# Patient Record
Sex: Female | Born: 1975 | Race: White | Hispanic: No | Marital: Married | Smoking: Current every day smoker
Health system: Southern US, Community
[De-identification: ages and names within clinical notes are randomized; demographics above are authoritative.]

## PROBLEM LIST (undated history)

## (undated) DIAGNOSIS — E039 Hypothyroidism, unspecified: Secondary | ICD-10-CM

## (undated) DIAGNOSIS — K219 Gastro-esophageal reflux disease without esophagitis: Secondary | ICD-10-CM

## (undated) DIAGNOSIS — G4730 Sleep apnea, unspecified: Secondary | ICD-10-CM

## (undated) DIAGNOSIS — Z8489 Family history of other specified conditions: Secondary | ICD-10-CM

## (undated) DIAGNOSIS — J45909 Unspecified asthma, uncomplicated: Secondary | ICD-10-CM

## (undated) DIAGNOSIS — F39 Unspecified mood [affective] disorder: Secondary | ICD-10-CM

## (undated) DIAGNOSIS — Z8632 Personal history of gestational diabetes: Secondary | ICD-10-CM

## (undated) DIAGNOSIS — F419 Anxiety disorder, unspecified: Secondary | ICD-10-CM

## (undated) DIAGNOSIS — T7840XA Allergy, unspecified, initial encounter: Secondary | ICD-10-CM

## (undated) DIAGNOSIS — E079 Disorder of thyroid, unspecified: Secondary | ICD-10-CM

## (undated) HISTORY — DX: Disorder of thyroid, unspecified: E07.9

## (undated) HISTORY — DX: Sleep apnea, unspecified: G47.30

## (undated) HISTORY — DX: Allergy, unspecified, initial encounter: T78.40XA

## (undated) HISTORY — DX: Anxiety disorder, unspecified: F41.9

---

## 1998-02-11 ENCOUNTER — Other Ambulatory Visit: Admission: RE | Admit: 1998-02-11 | Discharge: 1998-02-11 | Payer: Self-pay | Admitting: Obstetrics and Gynecology

## 1998-09-12 ENCOUNTER — Inpatient Hospital Stay (HOSPITAL_COMMUNITY): Admission: AD | Admit: 1998-09-12 | Discharge: 1998-09-15 | Payer: Self-pay | Admitting: Obstetrics and Gynecology

## 1998-10-15 ENCOUNTER — Other Ambulatory Visit: Admission: RE | Admit: 1998-10-15 | Discharge: 1998-10-15 | Payer: Self-pay | Admitting: Obstetrics and Gynecology

## 1999-12-10 ENCOUNTER — Emergency Department (HOSPITAL_COMMUNITY): Admission: EM | Admit: 1999-12-10 | Discharge: 1999-12-10 | Payer: Self-pay | Admitting: Emergency Medicine

## 1999-12-10 ENCOUNTER — Encounter: Payer: Self-pay | Admitting: Emergency Medicine

## 2000-01-08 ENCOUNTER — Other Ambulatory Visit: Admission: RE | Admit: 2000-01-08 | Discharge: 2000-01-08 | Payer: Self-pay | Admitting: Obstetrics and Gynecology

## 2000-07-08 ENCOUNTER — Inpatient Hospital Stay (HOSPITAL_COMMUNITY): Admission: AD | Admit: 2000-07-08 | Discharge: 2000-07-08 | Payer: Self-pay | Admitting: Obstetrics & Gynecology

## 2000-07-15 ENCOUNTER — Inpatient Hospital Stay (HOSPITAL_COMMUNITY): Admission: AD | Admit: 2000-07-15 | Discharge: 2000-07-16 | Payer: Self-pay | Admitting: Obstetrics and Gynecology

## 2000-07-22 ENCOUNTER — Encounter: Admission: RE | Admit: 2000-07-22 | Discharge: 2000-10-20 | Payer: Self-pay | Admitting: Obstetrics and Gynecology

## 2001-08-17 ENCOUNTER — Other Ambulatory Visit: Admission: RE | Admit: 2001-08-17 | Discharge: 2001-08-17 | Payer: Self-pay | Admitting: Obstetrics and Gynecology

## 2001-09-12 ENCOUNTER — Ambulatory Visit (HOSPITAL_COMMUNITY): Admission: RE | Admit: 2001-09-12 | Discharge: 2001-09-12 | Payer: Self-pay | Admitting: Obstetrics and Gynecology

## 2001-09-12 ENCOUNTER — Encounter: Payer: Self-pay | Admitting: Obstetrics and Gynecology

## 2001-11-21 ENCOUNTER — Encounter: Payer: Self-pay | Admitting: Obstetrics and Gynecology

## 2001-11-21 ENCOUNTER — Ambulatory Visit (HOSPITAL_COMMUNITY): Admission: RE | Admit: 2001-11-21 | Discharge: 2001-11-21 | Payer: Self-pay | Admitting: Obstetrics and Gynecology

## 2002-01-20 ENCOUNTER — Inpatient Hospital Stay (HOSPITAL_COMMUNITY): Admission: AD | Admit: 2002-01-20 | Discharge: 2002-01-23 | Payer: Self-pay | Admitting: Obstetrics and Gynecology

## 2002-01-21 ENCOUNTER — Encounter: Payer: Self-pay | Admitting: Obstetrics and Gynecology

## 2004-06-18 ENCOUNTER — Ambulatory Visit: Payer: Self-pay | Admitting: Family Medicine

## 2004-10-05 ENCOUNTER — Ambulatory Visit: Payer: Self-pay | Admitting: Family Medicine

## 2004-10-06 ENCOUNTER — Ambulatory Visit: Payer: Self-pay | Admitting: *Deleted

## 2004-10-06 ENCOUNTER — Ambulatory Visit: Payer: Self-pay | Admitting: Family Medicine

## 2004-10-13 ENCOUNTER — Ambulatory Visit: Payer: Self-pay | Admitting: Family Medicine

## 2004-10-27 ENCOUNTER — Ambulatory Visit: Payer: Self-pay | Admitting: Family Medicine

## 2004-11-03 ENCOUNTER — Ambulatory Visit: Payer: Self-pay | Admitting: Family Medicine

## 2004-11-04 ENCOUNTER — Encounter (INDEPENDENT_AMBULATORY_CARE_PROVIDER_SITE_OTHER): Payer: Self-pay | Admitting: Family Medicine

## 2004-11-04 LAB — CONVERTED CEMR LAB: Pap Smear: NORMAL

## 2004-11-10 ENCOUNTER — Ambulatory Visit: Payer: Self-pay | Admitting: Family Medicine

## 2004-12-01 ENCOUNTER — Ambulatory Visit: Payer: Self-pay | Admitting: Family Medicine

## 2005-10-27 ENCOUNTER — Ambulatory Visit: Payer: Self-pay | Admitting: Family Medicine

## 2005-11-18 ENCOUNTER — Ambulatory Visit: Payer: Self-pay | Admitting: Family Medicine

## 2006-11-08 ENCOUNTER — Ambulatory Visit: Payer: Self-pay | Admitting: Family Medicine

## 2007-02-20 ENCOUNTER — Ambulatory Visit: Payer: Self-pay | Admitting: Internal Medicine

## 2007-04-24 ENCOUNTER — Ambulatory Visit: Payer: Self-pay | Admitting: Internal Medicine

## 2007-04-24 LAB — CONVERTED CEMR LAB: TSH: 1.773 microintl units/mL (ref 0.350–5.50)

## 2007-05-03 ENCOUNTER — Encounter (INDEPENDENT_AMBULATORY_CARE_PROVIDER_SITE_OTHER): Payer: Self-pay | Admitting: *Deleted

## 2007-06-29 ENCOUNTER — Encounter (INDEPENDENT_AMBULATORY_CARE_PROVIDER_SITE_OTHER): Payer: Self-pay | Admitting: Family Medicine

## 2007-06-29 DIAGNOSIS — F17200 Nicotine dependence, unspecified, uncomplicated: Secondary | ICD-10-CM | POA: Insufficient documentation

## 2007-06-29 DIAGNOSIS — E039 Hypothyroidism, unspecified: Secondary | ICD-10-CM | POA: Insufficient documentation

## 2007-06-29 DIAGNOSIS — F411 Generalized anxiety disorder: Secondary | ICD-10-CM | POA: Insufficient documentation

## 2007-12-29 ENCOUNTER — Ambulatory Visit: Payer: Self-pay | Admitting: Internal Medicine

## 2008-01-22 ENCOUNTER — Ambulatory Visit: Payer: Self-pay | Admitting: Internal Medicine

## 2008-08-06 ENCOUNTER — Ambulatory Visit: Payer: Self-pay | Admitting: Family Medicine

## 2008-08-06 LAB — CONVERTED CEMR LAB
BUN: 13 mg/dL (ref 6–23)
Calcium: 9.4 mg/dL (ref 8.4–10.5)
Cholesterol: 251 mg/dL — ABNORMAL HIGH (ref 0–200)
Creatinine, Ser: 0.91 mg/dL (ref 0.40–1.20)
Eosinophils Absolute: 0.8 10*3/uL — ABNORMAL HIGH (ref 0.0–0.7)
Eosinophils Relative: 9 % — ABNORMAL HIGH (ref 0–5)
Free T4: 0.3 ng/dL — ABNORMAL LOW (ref 0.89–1.80)
Glucose, Bld: 75 mg/dL (ref 70–99)
HCT: 46 % (ref 36.0–46.0)
Lymphs Abs: 2.9 10*3/uL (ref 0.7–4.0)
MCV: 92.4 fL (ref 78.0–100.0)
Monocytes Absolute: 0.8 10*3/uL (ref 0.1–1.0)
Platelets: 251 10*3/uL (ref 150–400)
Sodium: 139 meq/L (ref 135–145)
Total CHOL/HDL Ratio: 3.6
WBC: 9.3 10*3/uL (ref 4.0–10.5)

## 2009-03-05 ENCOUNTER — Ambulatory Visit: Payer: Self-pay | Admitting: Family Medicine

## 2009-04-07 ENCOUNTER — Encounter (INDEPENDENT_AMBULATORY_CARE_PROVIDER_SITE_OTHER): Payer: Self-pay | Admitting: Adult Health

## 2009-04-07 ENCOUNTER — Ambulatory Visit: Payer: Self-pay | Admitting: Family Medicine

## 2009-04-07 LAB — CONVERTED CEMR LAB
T3 Uptake Ratio: 47.5 % — ABNORMAL HIGH (ref 22.5–37.0)
T4, Total: 12.3 ug/dL (ref 5.0–12.5)

## 2009-06-11 ENCOUNTER — Encounter (INDEPENDENT_AMBULATORY_CARE_PROVIDER_SITE_OTHER): Payer: Self-pay | Admitting: Adult Health

## 2009-06-11 ENCOUNTER — Ambulatory Visit: Payer: Self-pay | Admitting: Family Medicine

## 2009-09-18 ENCOUNTER — Encounter: Admission: RE | Admit: 2009-09-18 | Discharge: 2009-09-18 | Payer: Self-pay | Admitting: Internal Medicine

## 2009-10-10 ENCOUNTER — Encounter: Admission: RE | Admit: 2009-10-10 | Discharge: 2009-10-10 | Payer: Self-pay | Admitting: Internal Medicine

## 2011-01-01 NOTE — H&P (Signed)
[facility]  Patient:    Valerie Bernard, Valerie Bernard                      MRN: [id number] Adm. Date:  [id number] Disc. Date: [id number] Attending:  Pleas Koch Dictator:   Mack Guise, C.N.M.                         History and Physical  HISTORY OF PRESENT ILLNESS:   Ms. Leamer is a 35 year old gravida 3, para 1-0-1-1 at 39-5/7 weeks (EDD July 17, 2000).  She presents with increasing symptoms of essential labor.  She reports positive fetal movement, no bleeding, no rupture of membranes.  She denies any headaches, visual changes or epigastric pain.  CURRENT PREGNANCY:  Her pregnancy has been followed by the [facility] and is remarkable for: 1. Hypothyroidism. 2. Group B strep negative.  This patient was initially evaluated at the office of [facility] on Jan 07, 2000 at 12 weeks 3 days.  Her EDD was determined by 8 weeks 4 day ultrasound, and confirmed with follow up ultrasound.  Her prenatal course has been unremarkable, and throughout the growth of uterine fundus has been noted to be compatible in size for the infant.  Systolic blood pressure has ranged from 100 to 120.  Diastolic blood pressure has ranged from 60 to 70.  There has been no significant proteinuria.  PRENATAL LAB WORK:  Hemoglobin 13.9, hematocrit 41.8 (December 10, 1999), platelets 234,000.  Blood type and Rh 0 positive.  Antibody screen negative. VDRL nonreactive.  Rubella immune.  Hepatitis B surface antigen.  GC and Chlamydia negative.  Pap smear within normal limits.  Positive yeast.  TSH at first trimester 34.126.  On January 16, 2000 AFP/3 ______ HCG within normal range.  At 36 weeks culture of the vaginal tract was negative for Group B strep.  OB HISTORY: 1. Induced AB 1998. 2. January 2000 normal spontaneous vaginal delivery, with the birth of an    8 pound 14 ounce female infant.   No complications.  MEDICAL HISTORY: DD:  07/15/00 TD:  07/15/00 Job: [id number]

## 2011-01-01 NOTE — H&P (Signed)
[facility]  Patient:    Valerie Bernard, Valerie Bernard                      MRN: [id number] Adm. Date:  [id number] Disc. Date: [id number] Attending:  Pleas Koch Dictator:   Mack Guise, C.N.M.                         History and Physical  PAST MEDICAL HISTORY:         The patient has a history of hypothyroidism.  FAMILY HISTORY:               Maternal grandmother has varicose veins otherwise unremarkable.  GENETIC HISTORY:              There is no family history of genetic or chromosomal disorders, or children that died in infancy, or that were born with birth defects.  ALLERGIES:                    No known drug allergies.  MEDICATIONS:                  Synthroid and prenatal vitamins.  HABITS:                       The patient denies the use of tobacco, alcohol or illicit drugs.  SOCIAL HISTORY:               [name] is a 35 year old, Caucasian female. Her husband, Genine Beckett, works as a Tree surgeon.  He is involved and supportive.  They are of the [place] faith.  REVIEW OF SYSTEMS:            There are no signs or symptoms suggestive of focal or systemic disease and the patient is a typical one with a uterine pregnancy at term in early labor.  PHYSICAL EXAMINATION:  VITAL SIGNS:                  Stable, afebrile.  Fetal heart rate 130-140 with average long-term variability.  Reactivity is present with no periodic changes.  HEENT:                        Unremarkable.  Thyroid not enlarged.  HEART:                        Regular rate and rhythm.  LUNGS:                        Clear.  ABDOMEN:                      Gravid in its contour.  Uterine fundus is noted to extend 40 cm above the level of the pubic symphysis.  Thayer Ohm maneuvers finds the infant being in longitudinal lie and cephalic presentation and the estimated fetal weight is 8 to 8-1/2 pounds.  PELVIC:                       Digital examination of the cervix finds it  to be 4 cm dilated, 80% effaced with the cephalic presenting part at a -3 station and membranes intact per RN examination in MAU.  ASSESSMENT:  Intrauterine pregnancy, at term in early labor.  PLAN:                         Admit to birthing suite per Dr. Dierdre Forth. Routine CNM orders.  The patient may have epidural.  Anticipate spontaneous vaginal delivery. DD:  07/15/00 TD:  07/15/00 Job: [id number]

## 2012-02-24 ENCOUNTER — Ambulatory Visit: Payer: Self-pay | Admitting: Physician Assistant

## 2012-02-24 VITALS — BP 124/76 | HR 65 | Temp 97.9°F | Resp 18 | Ht 65.5 in | Wt 202.0 lb

## 2012-02-24 DIAGNOSIS — E039 Hypothyroidism, unspecified: Secondary | ICD-10-CM

## 2012-02-24 MED ORDER — LEVOTHYROXINE SODIUM 200 MCG PO TABS: 200.0000 ug | ORAL_TABLET | Freq: Every day | ORAL | Status: DC

## 2012-02-24 NOTE — Progress Notes (Signed)
  Subjective:    Patient ID: Valerie Bernard, female    DOB: 1975/09/24, 36 y.o.   MRN: [id number]  HPI Valerie Bernard comes in today requesting she have her thyroid checked and medication refilled.  She has been on Synthroid 200 mcg for several years but has had to have her TSH checked frequently in the last 6 months due to non compliance and levels being unstable.  She has been on her meds consistently now for some time and states that she has no abnormal complaints.  She states that she is always tired, overweight and has dry skin.  She also notes that her goal range was a lower TSH for better symptomatic relief.  She states that she is healthy otherwise and is on no other meds   Review of Systems ROS As noted in HPI, otherwise negative     Objective:   Physical Exam  Constitutional: She is oriented to person, place, and time. She appears well-developed and well-nourished.  Eyes: Pupils are equal, round, and reactive to light. No scleral icterus.  Neck: No thyromegaly present.  Cardiovascular: Normal rate and regular rhythm.   Pulmonary/Chest: Effort normal and breath sounds normal.  Neurological: She is alert and oriented to person, place, and time.  Skin: Skin is warm and dry.          Assessment & Plan:  Hypothyroidism  Check TSH Synthroid 200 mcg one a day.  RF for 3 months.  Asked that she get prior lab results to [place] if we are going to continue prescribing her medication.

## 2012-02-24 NOTE — Patient Instructions (Addendum)
We will determine your recheck after your labs are back AND we can review your previous TSH levels from [facility].  Please get that information to [place] when possible in the near future

## 2012-03-14 ENCOUNTER — Other Ambulatory Visit: Payer: Self-pay | Admitting: Physician Assistant

## 2012-03-14 NOTE — Telephone Encounter (Signed)
Pharmacy sent message requesting generic, I called and authorized.

## 2012-08-25 ENCOUNTER — Encounter: Payer: Self-pay | Admitting: Family Medicine

## 2012-08-25 ENCOUNTER — Ambulatory Visit (INDEPENDENT_AMBULATORY_CARE_PROVIDER_SITE_OTHER): Payer: 59 | Admitting: Family Medicine

## 2012-08-25 VITALS — BP 122/82 | HR 70 | Temp 98.2°F | Resp 16 | Ht 64.5 in | Wt 205.0 lb

## 2012-08-25 DIAGNOSIS — E039 Hypothyroidism, unspecified: Secondary | ICD-10-CM

## 2012-08-25 DIAGNOSIS — R42 Dizziness and giddiness: Secondary | ICD-10-CM

## 2012-08-25 DIAGNOSIS — F17200 Nicotine dependence, unspecified, uncomplicated: Secondary | ICD-10-CM

## 2012-08-25 DIAGNOSIS — Z23 Encounter for immunization: Secondary | ICD-10-CM

## 2012-08-25 MED ORDER — LEVOTHYROXINE SODIUM 200 MCG PO TABS: 200.0000 ug | ORAL_TABLET | Freq: Every day | ORAL | Status: DC

## 2012-08-25 NOTE — Patient Instructions (Signed)
Benign Positional Vertigo  Vertigo means you feel like you or your surroundings are moving when they are not. Benign positional vertigo is the most common form of vertigo. Benign means that the cause of your condition is not serious. Benign positional vertigo is more common in older adults.  CAUSES   Benign positional vertigo is the result of an upset in the labyrinth system. This is an area in the middle ear that helps control your balance. This may be caused by a viral infection, head injury, or repetitive motion. However, often no specific cause is found.  SYMPTOMS   Symptoms of benign positional vertigo occur when you move your head or eyes in different directions. Some of the symptoms may include:  · Loss of balance and falls.  · Vomiting.  · Blurred vision.  · Dizziness.  · Nausea.  · Involuntary eye movements (nystagmus).  DIAGNOSIS   Benign positional vertigo is usually diagnosed by physical exam. If the specific cause of your benign positional vertigo is unknown, your caregiver may perform imaging tests, such as magnetic resonance imaging (MRI) or computed tomography (CT).  TREATMENT   Your caregiver may recommend movements or procedures to correct the benign positional vertigo. Medicines such as meclizine, benzodiazepines, and medicines for nausea may be used to treat your symptoms. In rare cases, if your symptoms are caused by certain conditions that affect the inner ear, you may need surgery.  HOME CARE INSTRUCTIONS   · Follow your caregiver's instructions.  · Move slowly. Do not make sudden body or head movements.  · Avoid driving.  · Avoid operating heavy machinery.  · Avoid performing any tasks that would be dangerous to you or others during a vertigo episode.  · Drink enough fluids to keep your urine clear or pale yellow.  SEEK IMMEDIATE MEDICAL CARE IF:   · You develop problems with walking, weakness, numbness, or using your arms, hands, or legs.  · You have difficulty speaking.  · You develop  severe headaches.  · Your nausea or vomiting continues or gets worse.  · You develop visual changes.  · Your family or friends notice any behavioral changes.  · Your condition gets worse.  · You have a fever.  · You develop a stiff neck or sensitivity to light.  MAKE SURE YOU:   · Understand these instructions.  · Will watch your condition.  · Will get help right away if you are not doing well or get worse.  Document Released: 05/10/2006 Document Revised: 10/25/2011 Document Reviewed: 04/22/2011  ExitCare® Patient Information ©2013 [employer].

## 2012-10-06 ENCOUNTER — Ambulatory Visit (INDEPENDENT_AMBULATORY_CARE_PROVIDER_SITE_OTHER): Payer: 59 | Admitting: Family Medicine

## 2012-10-06 ENCOUNTER — Encounter: Payer: Self-pay | Admitting: Family Medicine

## 2012-10-06 VITALS — BP 110/74 | HR 75 | Temp 98.1°F | Resp 16 | Ht 65.5 in | Wt 203.8 lb

## 2012-10-06 DIAGNOSIS — M792 Neuralgia and neuritis, unspecified: Secondary | ICD-10-CM

## 2012-10-06 DIAGNOSIS — E039 Hypothyroidism, unspecified: Secondary | ICD-10-CM

## 2012-10-06 DIAGNOSIS — M5410 Radiculopathy, site unspecified: Secondary | ICD-10-CM

## 2012-10-06 DIAGNOSIS — M5137 Other intervertebral disc degeneration, lumbosacral region: Secondary | ICD-10-CM

## 2012-10-06 DIAGNOSIS — IMO0002 Reserved for concepts with insufficient information to code with codable children: Secondary | ICD-10-CM

## 2012-10-06 LAB — CBC WITH DIFFERENTIAL/PLATELET
Basophils Absolute: 0 10*3/uL (ref 0.0–0.1)
Basophils Relative: 1 % (ref 0–1)
Eosinophils Relative: 5 % (ref 0–5)
HCT: 41.3 % (ref 36.0–46.0)
MCH: 30.8 pg (ref 26.0–34.0)
MCHC: 34.6 g/dL (ref 30.0–36.0)
MCV: 88.8 fL (ref 78.0–100.0)
Monocytes Absolute: 0.5 10*3/uL (ref 0.1–1.0)
RDW: 12.9 % (ref 11.5–15.5)

## 2012-10-06 LAB — COMPREHENSIVE METABOLIC PANEL
AST: 16 U/L (ref 0–37)
Alkaline Phosphatase: 54 U/L (ref 39–117)
BUN: 12 mg/dL (ref 6–23)
Calcium: 9.2 mg/dL (ref 8.4–10.5)
Chloride: 103 mEq/L (ref 96–112)
Creat: 0.81 mg/dL (ref 0.50–1.10)

## 2012-10-06 LAB — LIPID PANEL
HDL: 52 mg/dL (ref >39)
Triglycerides: 84 mg/dL (ref <150)

## 2012-10-06 LAB — RPR

## 2012-10-06 LAB — TSH: TSH: 12.047 u[IU]/mL — ABNORMAL HIGH (ref 0.350–4.500)

## 2012-10-06 NOTE — Progress Notes (Signed)
Subjective:    Patient ID: Valerie Bernard, female    DOB: 04-01-1976, 37 y.o.   MRN: [id number] Chief Complaint  Patient presents with  . Follow-up    6 wks on thyroid    HPI  Trying to get used to taking levothyroxine every day but sometimes forgets and will take after breakfast.  She took one last night as she forgot it yest a.m. And did not take this morning - she is not sure.   Today she is fasting.  Also started a women's mvi today.  Just saw provider at [facility] the last time she had a pap smear - was prob about 2 yrs ago- so not sure when and had an abnml pap smear many yrs ago but was nml on recheck.  She has two herniated disc in her low back - prob L4-5, L5-S1. She has had problems with these in the past and now they are worsening.  she was having some left sciatica from this and now she is wondering if she having problems in her right leg as well now - pain down her leg and lateral side of right foot.  During the first flaire, her symptoms resolved after physical therapy a lone.  These sxs recurred just starting 1 wk ago. It is constant - worse with standing.  No sig back pain. - more the leg pain that is really bothering her.  Past Medical History  Diagnosis Date  . Thyroid disease   . Anxiety    Current Outpatient Prescriptions on File Prior to Visit  Medication Sig Dispense Refill  . levothyroxine (SYNTHROID, LEVOTHROID) 200 MCG tablet Take 1 tablet (200 mcg total) by mouth daily.  90 tablet  0   No current facility-administered medications on file prior to visit.   No Known Allergies History reviewed. No pertinent past surgical history. Family History  Problem Relation Age of Onset  . Asthma Mother   . Diabetes Brother   . Arthritis Maternal Grandmother   . Arthritis Maternal Grandfather   . Cancer Paternal Grandfather     lung  . Asthma Daughter   . Allergies Daughter   . Asthma Son    History   Social History  . Marital Status: Married    Spouse Name: N/A    Number of Children: N/A  . Years of Education: N/A   Social History Main Topics  . Smoking status: Current Every Day Smoker -- 0.60 packs/day for 20 years    Types: Cigarettes  . Smokeless tobacco: None  . Alcohol Use: 0.6 oz/week    1 Glasses of wine per week  . Drug Use: None  . Sexually Active: Yes    Birth Control/ Protection: None   Other Topics Concern  . College educated   Social History Narrative  . Works as a Runner, broadcasting/film/video in [employer]   Does not exercise  Review of Systems  Constitutional: Negative for fever, chills and unexpected weight change.  HENT: Negative for trouble swallowing.   Gastrointestinal: Negative for nausea, vomiting, abdominal pain, diarrhea and constipation.  Genitourinary: Negative for urgency, frequency, decreased urine volume and difficulty urinating.  Musculoskeletal: Positive for myalgias and back pain. Negative for joint swelling, arthralgias and gait problem.  Skin: Negative for rash.  Neurological: Positive for weakness and numbness.  Hematological: Negative for adenopathy. Does not bruise/bleed easily.  Psychiatric/Behavioral: Negative for dysphoric mood. The patient is nervous/anxious.       BP 110/74  Pulse 75  Temp(Src) 98.1 F (36.7  C) (Oral)  Resp 16  Ht 5' 5.5" (1.664 m)  Wt 203 lb 12.8 oz (92.443 kg)  BMI 33.39 kg/m2  SpO2 96%  LMP 10/03/2012 Objective:   Physical Exam  Constitutional: She is oriented to person, place, and time. She appears well-developed and well-nourished. No distress.  HENT:  Head: Normocephalic and atraumatic.  Right Ear: External ear normal.  Left Ear: External ear normal.  Eyes: Conjunctivae are normal. No scleral icterus.  Neck: Normal range of motion. Neck supple. No thyromegaly present.  Cardiovascular: Normal rate, regular rhythm, normal heart sounds and intact distal pulses.   Pulmonary/Chest: Effort normal and breath sounds normal. No respiratory distress.  Musculoskeletal: Normal range  of motion. She exhibits no edema.       Right hip: Normal.       Left hip: Normal.       Lumbar back: She exhibits tenderness, pain and spasm. She exhibits normal range of motion, no bony tenderness and no deformity.  Lymphadenopathy:    She has no cervical adenopathy.  Neurological: She is alert and oriented to person, place, and time. She has normal strength and normal reflexes. No sensory deficit. She exhibits normal muscle tone. Coordination and gait normal.  Reflex Scores:      Patellar reflexes are 2+ on the right side and 2+ on the left side.      Achilles reflexes are 2+ on the right side and 2+ on the left side. Skin: Skin is warm and dry. She is not diaphoretic. No erythema.  Psychiatric: She has a normal mood and affect. Her behavior is normal.      Assessment & Plan:   HYPOTHYROIDISM - Plan: TSH, Lipid panel.  Will need to check tsh today and interpret based upon admitted pt non-compliance with med.  Discussed ways to work into daily schedule.  Recheck again in 6-8 wks.  Degeneration of lumbar or lumbosacral intervertebral disc - Plan: Vitamin D 25 hydroxy. As long as cbg is ok, will do prednisone burst and if radiculopathy/weakness/numbness cont or worsen, will refer to PT.  Radiculopathy of leg - Plan: CBC with Differential  Neuralgia - Plan: POCT glucose (manual entry), RPR, CBC with Differential, Comprehensive metabolic panel  HM - Plan for CPP at next OV in 6-8 wks when pt is also due for TSH recheck.  Will need pap at that time and can review labs.  TDaP was within the last 5 hrs per pt.

## 2012-10-07 LAB — VITAMIN D 25 HYDROXY (VIT D DEFICIENCY, FRACTURES): Vit D, 25-Hydroxy: 28 ng/mL — ABNORMAL LOW (ref 30–89)

## 2012-10-08 ENCOUNTER — Encounter: Payer: Self-pay | Admitting: Family Medicine

## 2012-10-09 ENCOUNTER — Telehealth: Payer: Self-pay | Admitting: Family Medicine

## 2012-10-09 MED ORDER — PREDNISONE 20 MG PO TABS: ORAL_TABLET | ORAL | Status: DC

## 2012-10-09 NOTE — Telephone Encounter (Signed)
Prednisone taper sent to pharmacy. 

## 2012-10-09 NOTE — Telephone Encounter (Signed)
I have sent pt a reply to her MyChart email notifying her that we have sent in her prednisone Rx to [facility].

## 2012-10-09 NOTE — Telephone Encounter (Signed)
Prescription for prednisone was not called in to my pharmacy. (via pt advice request message) per Dr. Clelia Croft note she was going to prescribe prednisone. Please advise Dr. Clelia Croft not in office today thanks

## 2012-10-20 ENCOUNTER — Other Ambulatory Visit: Payer: Self-pay | Admitting: Family Medicine

## 2012-10-20 DIAGNOSIS — M5137 Other intervertebral disc degeneration, lumbosacral region: Secondary | ICD-10-CM

## 2012-11-07 NOTE — Progress Notes (Signed)
  Subjective:    Patient ID: Valerie Bernard, female    DOB: Dec 22, 1975, 37 y.o.   MRN: [id number] Chief Complaint  Patient presents with  . Hypothyroidism    thyroid check    HPI  Has had hypothyroid for a long time but is really bad about taking her medication.  Has ran out of her levothyroxine for several weeks and was told she had to come in for blood work in order to restart it.  Has chronic back pain from DDD but it is getting worse and radiating down legs.  No desire for help with smoking cessation at this time - is working on it  Past Medical History  Diagnosis Date  . Thyroid disease   . Anxiety    No current outpatient prescriptions on file prior to visit.   No current facility-administered medications on file prior to visit.   No Known Allergies   Review of Systems  Constitutional: Positive for fatigue and unexpected weight change. Negative for fever, chills, diaphoresis, activity change and appetite change.  HENT: Negative for trouble swallowing.   Cardiovascular: Negative for palpitations.  Gastrointestinal: Negative for diarrhea and constipation.  Endocrine: Negative for cold intolerance and heat intolerance.  Genitourinary: Negative for frequency and decreased urine volume.  Musculoskeletal: Positive for myalgias and back pain. Negative for gait problem.  Skin: Negative for color change, pallor and rash.  Neurological: Negative for tremors, syncope, weakness and numbness.  Psychiatric/Behavioral: Positive for sleep disturbance and decreased concentration. Negative for behavioral problems and dysphoric mood. The patient is nervous/anxious.       BP 122/82  Pulse 70  Temp(Src) 98.2 F (36.8 C) (Oral)  Resp 16  Ht 5' 4.5" (1.638 m)  Wt 205 lb (92.987 kg)  BMI 34.66 kg/m2  LMP 08/11/2012 Objective:   Physical Exam  Constitutional: She is oriented to person, place, and time. She appears well-developed and well-nourished. No distress.  HENT:  Head:  Normocephalic and atraumatic.  Right Ear: External ear normal.  Left Ear: External ear normal.  Eyes: Conjunctivae are normal. No scleral icterus.  Neck: Normal range of motion. Neck supple. No thyromegaly present.  Cardiovascular: Normal rate, regular rhythm, normal heart sounds and intact distal pulses.   Pulmonary/Chest: Effort normal and breath sounds normal. No respiratory distress.  Musculoskeletal: She exhibits no edema.       Lumbar back: Normal.  Lymphadenopathy:    She has no cervical adenopathy.  Neurological: She is alert and oriented to person, place, and time. She has normal strength and normal reflexes. She displays no atrophy. No sensory deficit. She exhibits normal muscle tone. Gait normal.  Skin: Skin is warm and dry. She is not diaphoretic. No erythema.  Psychiatric: She has a normal mood and affect. Her behavior is normal.      Assessment & Plan:  HYPOTHYROIDISM - Plan: levothyroxine (SYNTHROID, LEVOTHROID) 200 MCG tablet  TOBACCO ABUSE  Vertigo  Need for prophylactic vaccination and inoculation against influenza - Plan: Flu vaccine greater than or equal to 3yo preservative free IM Will plan to restart levothyroxine and schedule a CPP in 6-8 wks. Will recheck tsh at that time.  Meds ordered this encounter  Medications  . levothyroxine (SYNTHROID, LEVOTHROID) 200 MCG tablet    Sig: Take 1 tablet (200 mcg total) by mouth daily.    Dispense:  90 tablet    Refill:  0

## 2013-01-05 ENCOUNTER — Other Ambulatory Visit: Payer: Self-pay | Admitting: Family Medicine

## 2013-02-23 ENCOUNTER — Other Ambulatory Visit: Payer: Self-pay | Admitting: Physician Assistant

## 2013-02-23 NOTE — Telephone Encounter (Signed)
Med encounter

## 2013-02-27 ENCOUNTER — Ambulatory Visit (INDEPENDENT_AMBULATORY_CARE_PROVIDER_SITE_OTHER): Payer: 59 | Admitting: Family Medicine

## 2013-02-27 VITALS — BP 124/76 | HR 73 | Temp 98.0°F | Resp 16 | Ht 65.0 in | Wt 205.0 lb

## 2013-02-27 DIAGNOSIS — F17200 Nicotine dependence, unspecified, uncomplicated: Secondary | ICD-10-CM

## 2013-02-27 DIAGNOSIS — E039 Hypothyroidism, unspecified: Secondary | ICD-10-CM

## 2013-02-27 NOTE — Progress Notes (Signed)
  Subjective:    Patient ID: Valerie Bernard, female    DOB: January 28, 1976, 37 y.o.   MRN: [id number] Chief Complaint  Patient presents with  . Follow-up    HPI  Doing much better after PT for lumbar radiculopathy, no further numbness, no weakness, minimal back pain.  Has gotten better at taking the levothyroxine daily.  Managing to take it 5/7 days - if she misses a dose, she takes it whenever she remembers so sometime taking it w/ food or on a full stomach.    Was out on a "girls night" last night so smoked much more than usual.  Plans to make appt for CPP - does not want to do today.    Past Medical History  Diagnosis Date  . Thyroid disease   . Anxiety    Current Outpatient Prescriptions on File Prior to Visit  Medication Sig Dispense Refill  . levothyroxine (SYNTHROID, LEVOTHROID) 200 MCG tablet Take 1 tablet (200 mcg total) by mouth daily before breakfast. Needs office visit and labs  30 tablet  0   No current facility-administered medications on file prior to visit.   No Known Allergies   Review of Systems  Constitutional: Negative for fever, chills, diaphoresis, activity change and appetite change.  Respiratory: Negative for cough, shortness of breath and wheezing.   Gastrointestinal: Negative for diarrhea and constipation.  Musculoskeletal: Negative for myalgias, back pain, joint swelling, arthralgias and gait problem.  Neurological: Negative for weakness and numbness.      BP 124/76  Pulse 73  Temp(Src) 98 F (36.7 C) (Oral)  Resp 16  Ht 5\' 5"  (1.651 m)  Wt 205 lb (92.987 kg)  BMI 34.11 kg/m2  SpO2 98%  LMP 02/22/2013 Objective:   Physical Exam  Constitutional: She is oriented to person, place, and time. She appears well-developed and well-nourished. No distress.  HENT:  Head: Normocephalic and atraumatic.  Right Ear: External ear normal.  Left Ear: External ear normal.  Eyes: Conjunctivae are normal. No scleral icterus.  Neck: Normal range of motion. Neck  supple. No thyromegaly present.  Cardiovascular: Normal rate, regular rhythm, normal heart sounds and intact distal pulses.   Pulmonary/Chest: Effort normal. No respiratory distress. She has rhonchi in the left upper field.  Musculoskeletal: She exhibits no edema.  Lymphadenopathy:    She has no cervical adenopathy.  Neurological: She is alert and oriented to person, place, and time.  Skin: Skin is warm and dry. She is not diaphoretic. No erythema.  Psychiatric: She has a normal mood and affect. Her behavior is normal.     peak flow 450, 450, 400 - goal 487. Assessment & Plan:  Unspecified hypothyroidism - Plan: TSH - thinks she is about as compliant as she is going to get w/ med dose on 200 mcg qd though will try to set timer on phone.  HM - will make an appt for CPP and bring husband.

## 2013-03-01 ENCOUNTER — Other Ambulatory Visit: Payer: Self-pay | Admitting: Family Medicine

## 2013-03-01 MED ORDER — LEVOTHYROXINE SODIUM 50 MCG PO TABS: 50.0000 ug | ORAL_TABLET | Freq: Every day | ORAL | Status: DC

## 2013-03-01 MED ORDER — LEVOTHYROXINE SODIUM 200 MCG PO TABS: 200.0000 ug | ORAL_TABLET | Freq: Every day | ORAL | Status: DC

## 2013-04-11 ENCOUNTER — Emergency Department (HOSPITAL_BASED_OUTPATIENT_CLINIC_OR_DEPARTMENT_OTHER)
Admission: EM | Admit: 2013-04-11 | Discharge: 2013-04-11 | Disposition: A | Discharge status: Home / Self Care | Payer: 59 | Attending: Emergency Medicine | Admitting: Emergency Medicine

## 2013-04-11 ENCOUNTER — Encounter (HOSPITAL_BASED_OUTPATIENT_CLINIC_OR_DEPARTMENT_OTHER): Payer: Self-pay | Admitting: Emergency Medicine

## 2013-04-11 DIAGNOSIS — Z8659 Personal history of other mental and behavioral disorders: Secondary | ICD-10-CM | POA: Insufficient documentation

## 2013-04-11 DIAGNOSIS — N3090 Cystitis, unspecified without hematuria: Secondary | ICD-10-CM

## 2013-04-11 DIAGNOSIS — E079 Disorder of thyroid, unspecified: Secondary | ICD-10-CM | POA: Insufficient documentation

## 2013-04-11 DIAGNOSIS — R350 Frequency of micturition: Secondary | ICD-10-CM | POA: Insufficient documentation

## 2013-04-11 DIAGNOSIS — F17200 Nicotine dependence, unspecified, uncomplicated: Secondary | ICD-10-CM | POA: Insufficient documentation

## 2013-04-11 DIAGNOSIS — Z3202 Encounter for pregnancy test, result negative: Secondary | ICD-10-CM | POA: Insufficient documentation

## 2013-04-11 LAB — URINE MICROSCOPIC-ADD ON

## 2013-04-11 LAB — URINALYSIS, ROUTINE W REFLEX MICROSCOPIC
Bilirubin Urine: NEGATIVE
Glucose, UA: NEGATIVE mg/dL
Nitrite: NEGATIVE
Specific Gravity, Urine: 1.025 (ref 1.005–1.030)
pH: 6 (ref 5.0–8.0)

## 2013-04-11 MED ORDER — PHENAZOPYRIDINE HCL 100 MG PO TABS
200.0000 mg | ORAL_TABLET | Freq: Once | ORAL | Status: AC
  Administered 2013-04-11: 200 mg via ORAL
  Filled 2013-04-11: qty 2

## 2013-04-11 MED ORDER — NITROFURANTOIN MONOHYD MACRO 100 MG PO CAPS: 100.0000 mg | ORAL_CAPSULE | Freq: Two times a day (BID) | ORAL | Status: DC

## 2013-04-11 MED ORDER — PHENAZOPYRIDINE HCL 200 MG PO TABS: 200.0000 mg | ORAL_TABLET | Freq: Three times a day (TID) | ORAL | Status: DC

## 2013-04-11 MED ORDER — NITROFURANTOIN MONOHYD MACRO 100 MG PO CAPS
100.0000 mg | ORAL_CAPSULE | Freq: Once | ORAL | Status: AC
  Administered 2013-04-11: 100 mg via ORAL
  Filled 2013-04-11: qty 1

## 2013-04-11 MED ORDER — TRAMADOL HCL 50 MG PO TABS: 50.0000 mg | ORAL_TABLET | Freq: Four times a day (QID) | ORAL | Status: DC | PRN

## 2013-04-11 NOTE — ED Provider Notes (Signed)
CSN: [id number]     Arrival date & time 04/11/13  0536 History   None    Chief Complaint  Patient presents with  . Dysuria  . Urinary Frequency   (Consider location/radiation/quality/duration/timing/severity/associated sxs/prior Treatment) Patient is a 37 y.o. female presenting with dysuria and frequency. The history is provided by the patient.  Dysuria Pain quality:  Burning Pain severity:  Moderate Onset quality:  Gradual Timing:  Constant Progression:  Unchanged Chronicity:  New Relieved by:  Nothing Worsened by:  Nothing tried Ineffective treatments:  None tried Urinary symptoms: frequent urination   Urinary symptoms: no discolored urine   Associated symptoms: no abdominal pain   Risk factors: not pregnant   Urinary Frequency Pertinent negatives include no abdominal pain.    Past Medical History  Diagnosis Date  . Thyroid disease   . Anxiety    History reviewed. No pertinent past surgical history. Family History  Problem Relation Age of Onset  . Asthma Mother   . Diabetes Brother   . Arthritis Maternal Grandmother   . Arthritis Maternal Grandfather   . Cancer Paternal Grandfather     lung  . Asthma Daughter   . Allergies Daughter   . Asthma Son    History  Substance Use Topics  . Smoking status: Current Every Day Smoker -- 0.60 packs/day for 20 years    Types: Cigarettes  . Smokeless tobacco: Not on file  . Alcohol Use: 0.6 oz/week    1 Glasses of wine per week   OB History   Grav Para Term Preterm Abortions TAB SAB Ect Mult Living                 Review of Systems  Gastrointestinal: Negative for abdominal pain.  Genitourinary: Positive for dysuria and frequency.  All other systems reviewed and are negative.    Allergies  Review of patient's allergies indicates no known allergies.  Home Medications   Current Outpatient Rx  Name  Route  Sig  Dispense  Refill  . levothyroxine (SYNTHROID, LEVOTHROID) 200 MCG tablet   Oral   Take 1 tablet  (200 mcg total) by mouth daily before breakfast.   90 tablet   1   . levothyroxine (SYNTHROID, LEVOTHROID) 50 MCG tablet   Oral   Take 1 tablet (50 mcg total) by mouth daily before breakfast.   90 tablet   0    BP 116/70  Pulse 80  Temp(Src) 98 F (36.7 C) (Oral)  Resp 18  Ht 5\' 4"  (1.626 m)  Wt 205 lb (92.987 kg)  BMI 35.17 kg/m2  SpO2 97% Physical Exam  Constitutional: She is oriented to person, place, and time. She appears well-developed and well-nourished. No distress.  HENT:  Head: Normocephalic and atraumatic.  Mouth/Throat: Oropharynx is clear and moist.  Eyes: Conjunctivae are normal. Pupils are equal, round, and reactive to light.  Neck: Normal range of motion. Neck supple.  Cardiovascular: Normal rate, regular rhythm and intact distal pulses.   Pulmonary/Chest: Effort normal and breath sounds normal. She has no wheezes. She has no rales.  Abdominal: Soft. Bowel sounds are normal. There is no tenderness. There is no rebound and no guarding.  Musculoskeletal: Normal range of motion.  Neurological: She is alert and oriented to person, place, and time.  Skin: Skin is warm and dry.  Psychiatric: She has a normal mood and affect.    ED Course  Procedures (including critical care time) Labs Review Labs Reviewed  PREGNANCY, URINE  URINALYSIS, ROUTINE  W REFLEX MICROSCOPIC   Imaging Review No results found.  MDM  Will treat for UTI with macrobid and pain medication.  Follow up with your regular doctor for ongoing care    Aston Lawhorn K Tiant Peixoto-Rasch, MD 04/11/13 [phone]

## 2013-04-11 NOTE — ED Notes (Signed)
Pt c/o pain with urination and urinary frequency starting last night.

## 2013-04-12 LAB — URINE CULTURE: Colony Count: NO GROWTH

## 2013-06-21 ENCOUNTER — Other Ambulatory Visit: Payer: Self-pay

## 2013-08-15 ENCOUNTER — Telehealth: Payer: Self-pay

## 2013-08-15 NOTE — Telephone Encounter (Signed)
PT STATES SHE HAD SIGNED FOR [facility] TO SEND ALL HER RECORDS TO [place] AND NEED TO GET A COPY OF HER IMMUNIZATIONS. PLEASE CALL [phone] AND LET HER  KNOW THE TESTS SHE HAD DONE.

## 2013-09-09 ENCOUNTER — Other Ambulatory Visit: Payer: Self-pay | Admitting: Family Medicine

## 2014-02-04 ENCOUNTER — Other Ambulatory Visit: Payer: Self-pay | Admitting: Family Medicine

## 2014-02-25 ENCOUNTER — Emergency Department (HOSPITAL_BASED_OUTPATIENT_CLINIC_OR_DEPARTMENT_OTHER)
Admission: EM | Admit: 2014-02-25 | Discharge: 2014-02-25 | Disposition: A | Discharge status: Home / Self Care | Payer: BC Managed Care – PPO | Attending: Emergency Medicine | Admitting: Emergency Medicine

## 2014-02-25 ENCOUNTER — Encounter (HOSPITAL_BASED_OUTPATIENT_CLINIC_OR_DEPARTMENT_OTHER): Payer: Self-pay | Admitting: Emergency Medicine

## 2014-02-25 ENCOUNTER — Emergency Department (HOSPITAL_BASED_OUTPATIENT_CLINIC_OR_DEPARTMENT_OTHER): Payer: BC Managed Care – PPO

## 2014-02-25 DIAGNOSIS — E079 Disorder of thyroid, unspecified: Secondary | ICD-10-CM | POA: Insufficient documentation

## 2014-02-25 DIAGNOSIS — F17200 Nicotine dependence, unspecified, uncomplicated: Secondary | ICD-10-CM | POA: Insufficient documentation

## 2014-02-25 DIAGNOSIS — R0609 Other forms of dyspnea: Secondary | ICD-10-CM | POA: Insufficient documentation

## 2014-02-25 DIAGNOSIS — R0600 Dyspnea, unspecified: Secondary | ICD-10-CM

## 2014-02-25 DIAGNOSIS — Z79899 Other long term (current) drug therapy: Secondary | ICD-10-CM | POA: Insufficient documentation

## 2014-02-25 DIAGNOSIS — R0989 Other specified symptoms and signs involving the circulatory and respiratory systems: Secondary | ICD-10-CM | POA: Insufficient documentation

## 2014-02-25 DIAGNOSIS — F411 Generalized anxiety disorder: Secondary | ICD-10-CM | POA: Insufficient documentation

## 2014-02-25 LAB — COMPREHENSIVE METABOLIC PANEL
ALBUMIN: 4.1 g/dL (ref 3.5–5.2)
ALK PHOS: 66 U/L (ref 39–117)
ALT: 12 U/L (ref 0–35)
ANION GAP: 15 (ref 5–15)
AST: 19 U/L (ref 0–37)
BUN: 15 mg/dL (ref 6–23)
CALCIUM: 9.1 mg/dL (ref 8.4–10.5)
CO2: 23 mEq/L (ref 19–32)
CREATININE: 1 mg/dL (ref 0.50–1.10)
Chloride: 101 mEq/L (ref 96–112)
GFR calc Af Amer: 82 mL/min — ABNORMAL LOW (ref >90)
GFR calc non Af Amer: 71 mL/min — ABNORMAL LOW (ref >90)
Glucose, Bld: 108 mg/dL — ABNORMAL HIGH (ref 70–99)
POTASSIUM: 4.1 meq/L (ref 3.7–5.3)
Sodium: 139 mEq/L (ref 137–147)
TOTAL PROTEIN: 7.3 g/dL (ref 6.0–8.3)
Total Bilirubin: <0.2 mg/dL — ABNORMAL LOW (ref 0.3–1.2)

## 2014-02-25 LAB — CBC WITH DIFFERENTIAL/PLATELET
Basophils Absolute: 0.1 10*3/uL (ref 0.0–0.1)
Basophils Relative: 0 % (ref 0–1)
Eosinophils Absolute: 0.5 10*3/uL (ref 0.0–0.7)
Eosinophils Relative: 5 % (ref 0–5)
HEMATOCRIT: 41.8 % (ref 36.0–46.0)
HEMOGLOBIN: 14.4 g/dL (ref 12.0–15.0)
LYMPHS ABS: 4.9 10*3/uL — AB (ref 0.7–4.0)
LYMPHS PCT: 43 % (ref 12–46)
MCH: 31.8 pg (ref 26.0–34.0)
MCHC: 34.4 g/dL (ref 30.0–36.0)
MCV: 92.3 fL (ref 78.0–100.0)
MONO ABS: 1 10*3/uL (ref 0.1–1.0)
MONOS PCT: 9 % (ref 3–12)
NEUTROS ABS: 4.9 10*3/uL (ref 1.7–7.7)
Neutrophils Relative %: 43 % (ref 43–77)
Platelets: 235 10*3/uL (ref 150–400)
RBC: 4.53 MIL/uL (ref 3.87–5.11)
RDW: 13 % (ref 11.5–15.5)
WBC: 11.3 10*3/uL — AB (ref 4.0–10.5)

## 2014-02-25 LAB — TROPONIN I: Troponin I: <0.3 ng/mL (ref <0.30)

## 2014-02-25 NOTE — ED Provider Notes (Signed)
CSN: [id number]     Arrival date & time 02/25/14  0210 History   First MD Initiated Contact with Patient 02/25/14 0257     Chief Complaint  Patient presents with  . Shortness of Breath      Patient is a 38 y.o. female presenting with shortness of breath. The history is provided by the patient.  Shortness of Breath Severity:  Mild Onset quality:  Gradual Duration:  2 hours Timing:  Constant Progression:  Unchanged Chronicity:  New Relieved by:  None tried Worsened by:  Nothing tried Associated symptoms: no abdominal pain, no chest pain, no cough, no diaphoresis, no fever, no hemoptysis, no syncope and no vomiting   Risk factors: tobacco use   Risk factors: no hx of PE/DVT and no recent surgery   Pt reports she woke up feeling SOB She reports she can not "take a deep breath" No chest pain but reports mild "heart burn" but no nausea/vomiting No orthopnea No dyspnea on exertion No LE pain/edema No h/o DVT No personal h/o CAD No family h/o CAD She does not take any OCPs  She reports recently starting synthroid after her PCP noted elevated TSH She was told to go to ER if she had any CP/SOB after starting medications   Past Medical History  Diagnosis Date  . Thyroid disease   . Anxiety    History reviewed. No pertinent past surgical history. Family History  Problem Relation Age of Onset  . Asthma Mother   . Diabetes Brother   . Arthritis Maternal Grandmother   . Arthritis Maternal Grandfather   . Cancer Paternal Grandfather     lung  . Asthma Daughter   . Allergies Daughter   . Asthma Son    History  Substance Use Topics  . Smoking status: Current Every Day Smoker -- 0.60 packs/day for 20 years    Types: Cigarettes  . Smokeless tobacco: Not on file  . Alcohol Use: 0.6 oz/week    1 Glasses of wine per week   OB History   Grav Para Term Preterm Abortions TAB SAB Ect Mult Living                 Review of Systems  Constitutional: Negative for fever and  diaphoresis.  Respiratory: Positive for shortness of breath. Negative for cough and hemoptysis.   Cardiovascular: Negative for chest pain and syncope.  Gastrointestinal: Negative for vomiting and abdominal pain.  Neurological: Negative for dizziness and weakness.  All other systems reviewed and are negative.     Allergies  Review of patient's allergies indicates no known allergies.  Home Medications   Prior to Admission medications   Medication Sig Start Date End Date Taking? Authorizing Provider  levothyroxine (SYNTHROID, LEVOTHROID) 200 MCG tablet Take 1 tablet (200 mcg total) by mouth daily before breakfast. 03/01/13   Shawnee Knapp, MD  levothyroxine (SYNTHROID, LEVOTHROID) 50 MCG tablet Take 1 tablet (50 mcg total) by mouth every morning. PATIENT NEEDS OFFICE VISIT FOR ADDITIONAL REFILLS 09/09/13   Mancel Bale, PA-C  nitrofurantoin, macrocrystal-monohydrate, (MACROBID) 100 MG capsule Take 1 capsule (100 mg total) by mouth 2 (two) times daily. X 7 days 04/11/13   April K Palumbo-Rasch, MD  phenazopyridine (PYRIDIUM) 200 MG tablet Take 1 tablet (200 mg total) by mouth 3 (three) times daily. 04/11/13   April K Palumbo-Rasch, MD  traMADol (ULTRAM) 50 MG tablet Take 1 tablet (50 mg total) by mouth every 6 (six) hours as needed for pain. 04/11/13  April K Palumbo-Rasch, MD   BP 123/81  Pulse 67  Temp(Src) 98.3 F (36.8 C)  Resp 16  Ht 5' 4.5" (1.638 m)  Wt 220 lb (99.791 kg)  BMI 37.19 kg/m2  SpO2 100%  LMP 02/11/2014 Physical Exam CONSTITUTIONAL: Well developed/well nourished, watching TV, no distress noted HEAD: Normocephalic/atraumatic EYES: EOMI/PERRL ENMT: Mucous membranes moist NECK: supple no meningeal signs SPINE:entire spine nontender CV: S1/S2 noted, no murmurs/rubs/gallops noted LUNGS: Lungs are clear to auscultation bilaterally, no apparent distress ABDOMEN: soft, nontender, no rebound or guarding GU:no cva tenderness NEURO: Pt is awake/alert, moves all  extremitiesx4 EXTREMITIES: pulses normal, full ROM, no LE tenderness/edema SKIN: warm, color normal PSYCH: no abnormalities of mood noted  ED Course  Procedures   Pt reports she "can't take a deep breath" but is in no distress CXR negative for acute disease.  She ambulated in the ED in no distress, pulse ox>96% She appears PERC negative, doubt PE Low suspicion for ACS (no active CP, low risk) would not repeat troponin at this time I feel she is safe/stable for d/c home We discussed strict return precautions  Labs Review Labs Reviewed  COMPREHENSIVE METABOLIC PANEL - Abnormal; Notable for the following:    Glucose, Bld 108 (*)    Total Bilirubin <0.2 (*)    GFR calc non Af Amer 71 (*)    GFR calc Af Amer 82 (*)    All other components within normal limits  CBC WITH DIFFERENTIAL - Abnormal; Notable for the following:    WBC 11.3 (*)    Lymphs Abs 4.9 (*)    All other components within normal limits  TROPONIN I    Imaging Review Dg Chest 2 View  02/25/2014   CLINICAL DATA:  Shortness of breath.  Heartburn  EXAM: CHEST  2 VIEW  COMPARISON:  None.  FINDINGS: Normal heart size and mediastinal contours. No acute infiltrate or edema. No effusion or pneumothorax. No acute osseous findings.  IMPRESSION: No active cardiopulmonary disease.   Electronically Signed   By: Jorje Guild M.D.   On: 02/25/2014 03:27     EKG Interpretation   Date/Time:  Monday February 25 2014 02:40:03 EDT Ventricular Rate:  64 PR Interval:  142 QRS Duration: 92 QT Interval:  400 QTC Calculation: 412 R Axis:   86 Text Interpretation:  Normal sinus rhythm Nonspecific T wave abnormality  Abnormal ECG No previous ECGs available Confirmed by Christy Gentles  MD, Elenore Rota  [id number]) on 02/25/2014 2:41:14 AM      MDM   Final diagnoses:  Dyspnea    Nursing notes including past medical history and social history reviewed and considered in documentation xrays reviewed and considered Labs/vital reviewed and  considered     Sharyon Cable, MD 02/25/14 [phone]

## 2014-02-25 NOTE — ED Notes (Signed)
Pt reports she is having SOB and heart burn with recent hx of thyroid problems was told by her PMD to be seen if increasing SOB or chest pain occurred

## 2015-09-15 ENCOUNTER — Other Ambulatory Visit: Payer: Self-pay | Admitting: Physician Assistant

## 2015-09-17 ENCOUNTER — Other Ambulatory Visit: Payer: Self-pay | Admitting: Physician Assistant

## 2015-09-30 NOTE — Addendum Note (Signed)
Addended by: Mancel Bale on: 09/30/2015 12:29 PM   Modules accepted: Orders

## 2015-11-25 ENCOUNTER — Other Ambulatory Visit: Payer: Self-pay

## 2015-11-25 DIAGNOSIS — Z1231 Encounter for screening mammogram for malignant neoplasm of breast: Secondary | ICD-10-CM

## 2015-12-08 ENCOUNTER — Ambulatory Visit
Admission: RE | Admit: 2015-12-08 | Discharge: 2015-12-08 | Disposition: A | Discharge status: Home / Self Care | Payer: BC Managed Care – PPO | Source: Ambulatory Visit

## 2015-12-08 DIAGNOSIS — Z1231 Encounter for screening mammogram for malignant neoplasm of breast: Secondary | ICD-10-CM

## 2018-03-25 ENCOUNTER — Emergency Department (HOSPITAL_COMMUNITY): Payer: 59

## 2018-03-25 ENCOUNTER — Other Ambulatory Visit: Payer: Self-pay

## 2018-03-25 ENCOUNTER — Encounter (HOSPITAL_COMMUNITY): Payer: Self-pay

## 2018-03-25 ENCOUNTER — Emergency Department (HOSPITAL_COMMUNITY)
Admission: EM | Admit: 2018-03-25 | Discharge: 2018-03-25 | Disposition: A | Discharge status: Home / Self Care | Payer: 59 | Attending: Emergency Medicine | Admitting: Emergency Medicine

## 2018-03-25 DIAGNOSIS — E039 Hypothyroidism, unspecified: Secondary | ICD-10-CM | POA: Diagnosis not present

## 2018-03-25 DIAGNOSIS — Z79899 Other long term (current) drug therapy: Secondary | ICD-10-CM | POA: Diagnosis not present

## 2018-03-25 DIAGNOSIS — F17210 Nicotine dependence, cigarettes, uncomplicated: Secondary | ICD-10-CM | POA: Insufficient documentation

## 2018-03-25 DIAGNOSIS — R4689 Other symptoms and signs involving appearance and behavior: Secondary | ICD-10-CM | POA: Diagnosis not present

## 2018-03-25 DIAGNOSIS — R4701 Aphasia: Secondary | ICD-10-CM | POA: Diagnosis present

## 2018-03-25 LAB — URINALYSIS, ROUTINE W REFLEX MICROSCOPIC
Bacteria, UA: NONE SEEN
Bilirubin Urine: NEGATIVE
GLUCOSE, UA: NEGATIVE mg/dL
Ketones, ur: NEGATIVE mg/dL
Nitrite: NEGATIVE
PROTEIN: 100 mg/dL — AB
SPECIFIC GRAVITY, URINE: 1.004 — AB (ref 1.005–1.030)
pH: 6 (ref 5.0–8.0)

## 2018-03-25 LAB — CBC WITH DIFFERENTIAL/PLATELET
Abs Immature Granulocytes: 0 10*3/uL (ref 0.0–0.1)
BASOS ABS: 0.1 10*3/uL (ref 0.0–0.1)
Basophils Relative: 1 %
EOS ABS: 0.3 10*3/uL (ref 0.0–0.7)
EOS PCT: 4 %
HEMATOCRIT: 39.6 % (ref 36.0–46.0)
Hemoglobin: 13.2 g/dL (ref 12.0–15.0)
Immature Granulocytes: 0 %
Lymphocytes Relative: 34 %
Lymphs Abs: 3.1 10*3/uL (ref 0.7–4.0)
MCH: 30.1 pg (ref 26.0–34.0)
MCHC: 33.3 g/dL (ref 30.0–36.0)
MCV: 90.2 fL (ref 78.0–100.0)
Monocytes Absolute: 1 10*3/uL (ref 0.1–1.0)
Monocytes Relative: 11 %
Neutro Abs: 4.7 10*3/uL (ref 1.7–7.7)
Neutrophils Relative %: 50 %
PLATELETS: 248 10*3/uL (ref 150–400)
RBC: 4.39 MIL/uL (ref 3.87–5.11)
RDW: 12.1 % (ref 11.5–15.5)
WBC: 9.1 10*3/uL (ref 4.0–10.5)

## 2018-03-25 LAB — COMPREHENSIVE METABOLIC PANEL
ALBUMIN: 3.4 g/dL — AB (ref 3.5–5.0)
ALT: 17 U/L (ref 0–44)
AST: 18 U/L (ref 15–41)
Alkaline Phosphatase: 53 U/L (ref 38–126)
Anion gap: 8 (ref 5–15)
BILIRUBIN TOTAL: 0.4 mg/dL (ref 0.3–1.2)
BUN: 7 mg/dL (ref 6–20)
CHLORIDE: 107 mmol/L (ref 98–111)
CO2: 25 mmol/L (ref 22–32)
Calcium: 8.5 mg/dL — ABNORMAL LOW (ref 8.9–10.3)
Creatinine, Ser: 0.97 mg/dL (ref 0.44–1.00)
GFR calc Af Amer: >60 mL/min (ref >60)
GFR calc non Af Amer: >60 mL/min (ref >60)
Glucose, Bld: 90 mg/dL (ref 70–99)
POTASSIUM: 3.5 mmol/L (ref 3.5–5.1)
Sodium: 140 mmol/L (ref 135–145)
TOTAL PROTEIN: 6.2 g/dL — AB (ref 6.5–8.1)

## 2018-03-25 LAB — TSH: TSH: 4.936 u[IU]/mL — AB (ref 0.350–4.500)

## 2018-03-25 LAB — POC URINE PREG, ED: Preg Test, Ur: NEGATIVE

## 2018-03-25 MED ORDER — SODIUM CHLORIDE 0.9 % IV BOLUS
1000.0000 mL | Freq: Once | INTRAVENOUS | Status: AC
  Administered 2018-03-25: 1000 mL via INTRAVENOUS

## 2018-03-25 NOTE — ED Notes (Signed)
Patient transported to CT 

## 2018-03-25 NOTE — ED Provider Notes (Signed)
[facility] EMERGENCY DEPARTMENT Provider Note   CSN: [id number] Arrival date & time: 03/25/18  1256     History   Chief Complaint Chief Complaint  Patient presents with  . Aphasia    HPI Jazyiah Yiu is a 42 y.o. female.  Level 5 caveat for urgent need for intervention.  Patient allegedly had difficulty forming her words just prior to emergency visit.  Here she is tearful but speaking clearly.  No evidence of altered mental status.  No prodromal illnesses.  No facial drooping, facial asymmetry, confusion, stiff neck, motor or sensory deficits.  Past medical history of hypothyroidism and anxiety.  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  She admits to excessive stress at work.  No history of street drugs.  Nothing makes symptoms better or worse.     Past Medical History:  Diagnosis Date  . Anxiety   . Thyroid disease     Patient Active Problem List   Diagnosis Date Noted  . HYPOTHYROIDISM 06/29/2007  . ANXIETY 06/29/2007  . TOBACCO ABUSE 06/29/2007  . NORMAL DELIVERY 06/29/2007    History reviewed. No pertinent surgical history.   OB History   None      Home Medications    Prior to Admission medications   Medication Sig Start Date End Date Taking? Authorizing Provider  clonazePAM (KLONOPIN) 0.5 MG tablet Take 0.25-0.5 mg by mouth 2 (two) times daily as needed for anxiety. 01/29/18  Yes [provider]  ibuprofen (ADVIL,MOTRIN) 200 MG tablet Take 400 mg by mouth every 6 (six) hours as needed for headache or mild pain.   Yes [provider]  lamoTRIgine (LAMICTAL) 100 MG tablet Take 200 mg by mouth daily. 02/21/18  Yes [provider]  levothyroxine (SYNTHROID, LEVOTHROID) 200 MCG tablet Take 1 tablet (200 mcg total) by mouth daily before breakfast. Patient taking differently: Take 200 mcg by mouth daily before breakfast. Take with 42mcg tablet to equal 270mcg 03/01/13  Yes Shawnee Knapp, MD  levothyroxine  (SYNTHROID, LEVOTHROID) 75 MCG tablet Take 75 mcg by mouth daily. Take 1 tablet by mouth with 257mcg tablet to  Equal 28mcg 03/23/18  Yes [provider]  levothyroxine (SYNTHROID, LEVOTHROID) 50 MCG tablet Take 1 tablet (50 mcg total) by mouth every morning. PATIENT NEEDS OFFICE VISIT FOR ADDITIONAL REFILLS Patient not taking: Reported on 03/25/2018 09/09/13   Mancel Bale, PA-C  phenazopyridine (PYRIDIUM) 200 MG tablet Take 1 tablet (200 mg total) by mouth 3 (three) times daily. Patient not taking: Reported on 03/25/2018 04/11/13   Palumbo, April, MD  traMADol (ULTRAM) 50 MG tablet Take 1 tablet (50 mg total) by mouth every 6 (six) hours as needed for pain. Patient not taking: Reported on 03/25/2018 04/11/13   Veatrice Kells, MD    Family History Family History  Problem Relation Age of Onset  . Asthma Mother   . Diabetes Brother   . Arthritis Maternal Grandmother   . Arthritis Maternal Grandfather   . Cancer Paternal Grandfather        lung  . Asthma Daughter   . Allergies Daughter   . Asthma Son     Social History Social History   Tobacco Use  . Smoking status: Current Every Day Smoker    Packs/day: 0.60    Years: 20.00    Pack years: 12.00    Types: Cigarettes  . Smokeless tobacco: Never Used  Substance Use Topics  . Alcohol use: Yes  Alcohol/week: 1.0 standard drinks    Types: 1 Glasses of wine per week  . Drug use: No     Allergies   Patient has no known allergies.   Review of Systems Review of Systems  All other systems reviewed and are negative.    Physical Exam Updated Vital Signs BP 103/74   Pulse 62   Temp 98.7 F (37.1 C)   Resp 20   Ht 5\' 4"  (1.626 m)   Wt 97.5 kg   LMP 03/25/2018   SpO2 99%   BMI 36.90 kg/m   Physical Exam  Constitutional: She is oriented to person, place, and time. She appears well-developed and well-nourished.  HENT:  Head: Normocephalic and atraumatic.  Eyes: Conjunctivae are normal.  Neck: Neck supple.    Cardiovascular: Normal rate and regular rhythm.  Pulmonary/Chest: Effort normal and breath sounds normal.  Abdominal: Soft. Bowel sounds are normal.  Musculoskeletal: Normal range of motion.  Neurological: She is alert and oriented to person, place, and time.  Skin: Skin is warm and dry.  Psychiatric: She has a normal mood and affect. Her behavior is normal.  Nursing note and vitals reviewed.    ED Treatments / Results  Labs (all labs ordered are listed, but only abnormal results are displayed) Labs Reviewed  TSH - Abnormal; Notable for the following components:      Result Value   TSH 4.936 (*)    All other components within normal limits  URINALYSIS, ROUTINE W REFLEX MICROSCOPIC - Abnormal; Notable for the following components:   Color, Urine AMBER (*)    APPearance CLOUDY (*)    Specific Gravity, Urine 1.004 (*)    Hgb urine dipstick LARGE (*)    Protein, ur 100 (*)    Leukocytes, UA MODERATE (*)    All other components within normal limits  COMPREHENSIVE METABOLIC PANEL - Abnormal; Notable for the following components:   Calcium 8.5 (*)    Total Protein 6.2 (*)    Albumin 3.4 (*)    All other components within normal limits  URINE CULTURE  CBC WITH DIFFERENTIAL/PLATELET  POC URINE PREG, ED    EKG EKG Interpretation  Date/Time:  Saturday March 25 2018 13:46:59 EDT Ventricular Rate:  71 PR Interval:  132 QRS Duration: 96 QT Interval:  409 QTC Calculation: 445 R Axis:   86 Text Interpretation:  Sinus rhythm Confirmed by Nat Christen [phone]) on 03/25/2018 2:35:54 PM   Radiology Ct Head Wo Contrast  Result Date: 03/25/2018 CLINICAL DATA:  Altered level of consciousness. EXAM: CT HEAD WITHOUT CONTRAST TECHNIQUE: Contiguous axial images were obtained from the base of the skull through the vertex without intravenous contrast. COMPARISON:  None FINDINGS: Brain: No evidence of acute infarction, hemorrhage, hydrocephalus, extra-axial collection or mass lesion/mass  effect. Vascular: No hyperdense vessel or unexpected calcification. Skull: Negative for acute fracture. Benign cranial exostosis overlying the left frontal lobe measures 2.0 x 1.0 cm, image 23/3. Sinuses/Orbits: Mucosal thickening involving bilateral maxillary sinuses identified. Other: IMPRESSION: 1. No acute intracranial abnormalities. 2. Bilateral maxillary sinus mucosal thickening. Electronically Signed   By: Kerby Moors M.D.   On: 03/25/2018 16:31    Procedures Procedures (including critical care time)  Medications Ordered in ED Medications  sodium chloride 0.9 % bolus 1,000 mL (0 mLs Intravenous Stopped 03/25/18 1544)     Initial Impression / Assessment and Plan / ED Course  I have reviewed the triage vital signs and the nursing notes.  Pertinent labs & imaging results  that were available during my care of the patient were reviewed by me and considered in my medical decision making (see chart for details).     Patient was tearful on arrival in the emergency department, but no evidence of neurological deficits.  Work-up including labs, CT head, TSH negative.  Urinalysis will be cultured.  Husband reports normal behavior.  This could have been a TIA, but sxs are more consistent with an anxiety or stress reaction.  Patient was observed for several hours in the emergency department with no change in her neurological status  Final Clinical Impressions(s) / ED Diagnoses   Final diagnoses:  Behavioral change    ED Discharge Orders    None       Nat Christen, MD 03/25/18 1844

## 2018-03-25 NOTE — ED Triage Notes (Signed)
Pt states that everything she is trying to say has to be intentional, having trouble answer questions symptoms started 30 minutes ago. Pt ambulated into hospital, denies weakness, c/o tingling all all over, Tearful in triage.

## 2018-03-25 NOTE — Discharge Instructions (Addendum)
Tests showed no life-threatening condition.  CT head normal.  Drink lots of fluids.  Eat regular small meals.  Follow-up with your primary care doctor.

## 2018-03-25 NOTE — ED Notes (Signed)
Family at bedside. 

## 2018-03-25 NOTE — ED Notes (Signed)
Patient is alert and orientedx4.  Patient was explained discharge instructions and they understood them with no questions.  The patient's husband, Sherren Mocha is taking the patient home.

## 2018-03-26 LAB — URINE CULTURE

## 2018-05-31 ENCOUNTER — Other Ambulatory Visit: Payer: Self-pay | Admitting: Family Medicine

## 2018-06-01 ENCOUNTER — Other Ambulatory Visit: Payer: Self-pay | Admitting: Family Medicine

## 2018-06-01 DIAGNOSIS — R4701 Aphasia: Secondary | ICD-10-CM

## 2018-06-09 ENCOUNTER — Other Ambulatory Visit: Payer: Self-pay | Admitting: Family Medicine

## 2018-06-09 DIAGNOSIS — D47Z9 Other specified neoplasms of uncertain behavior of lymphoid, hematopoietic and related tissue: Secondary | ICD-10-CM

## 2018-06-21 ENCOUNTER — Ambulatory Visit
Admission: RE | Admit: 2018-06-21 | Discharge: 2018-06-21 | Disposition: A | Discharge status: Home / Self Care | Payer: 59 | Source: Ambulatory Visit | Attending: Family Medicine | Admitting: Family Medicine

## 2018-06-21 DIAGNOSIS — R4701 Aphasia: Secondary | ICD-10-CM

## 2018-09-19 ENCOUNTER — Encounter: Payer: Self-pay | Admitting: Obstetrics & Gynecology

## 2018-09-19 ENCOUNTER — Ambulatory Visit: Payer: 59 | Admitting: Obstetrics & Gynecology

## 2018-09-19 VITALS — BP 126/78 | Ht 64.0 in | Wt 222.0 lb

## 2018-09-19 DIAGNOSIS — F17200 Nicotine dependence, unspecified, uncomplicated: Secondary | ICD-10-CM | POA: Diagnosis not present

## 2018-09-19 DIAGNOSIS — E039 Hypothyroidism, unspecified: Secondary | ICD-10-CM | POA: Diagnosis not present

## 2018-09-19 DIAGNOSIS — N920 Excessive and frequent menstruation with regular cycle: Secondary | ICD-10-CM

## 2018-09-19 NOTE — Progress Notes (Signed)
    Julie-Anne Torain 1975-09-20 [id number]        43 y.o.  G3P3L3  Married.  Vasectomy  RP: Menorrhagia progressively worsening x 1 year  HPI: Heavy menstrual periods monthly, worsening x 1 year, lasting 7 days with first 3-4 days very heavy with overflow.  LMP 09/10/2018.  No pelvic pain, no pain with IC.  Last Hb 13.2 in 03/2018.  Hypothyroidism on Synthroid.  Cigarette smoker, <5 cig per day.  BMI 36.94.   OB History  Gravida Para Term Preterm AB Living  3 3       3   SAB TAB Ectopic Multiple Live Births               # Outcome Date GA Lbr Len/2nd Weight Sex Delivery Anes PTL Lv  3 Para           2 Para           1 Para             Past medical history,surgical history, problem list, medications, allergies, family history and social history were all reviewed and documented in the EPIC chart.   Directed ROS with pertinent positives and negatives documented in the history of present illness/assessment and plan.  Exam:  Vitals:   09/19/18 0948  BP: 126/78  Weight: 222 lb (100.7 kg)  Height: 5\' 5"  (1.651 m)   General appearance:  Normal  Abdomen: Normal  Gynecologic exam: Vulva normal.  Speculum:  Cervix/Vagina normal. Normal secretions, no blood.  Bimanual exam:  Uterus AV, normal volume, mobile, NT.  No adnexal mass, non-tender bilaterally.   Assessment/Plan:  43 y.o. G3P3   1. Menorrhagia with regular cycle Progressively worsening Menorrhagia. Heavy bleeding with overflow and missing work.  Will investigate with a CBC and TSH today.  F/U for Pelvic US to evaluate for Fibroids, Polyps, Endometrial Hyperplasia and Endometrial Ca.  Management per results.  Considering Progesterone IUD, Progestin-only pill or Endometrial Ablation.  Pamphlets and information given.   - CBC - TSH - US Transvaginal Non-OB; Future  2. Acquired hypothyroidism Will check TSH today.  3. TOBACCO ABUSE Strongly recommended to quit.  CI to Estrogen containing BCPs.  Counseling on above issues  and coordination of care >50% x 30 minutes.  Princess Bruins MD, 10:05 AM 09/19/2018

## 2018-09-19 NOTE — Patient Instructions (Signed)
1. Menorrhagia with regular cycle Progressively worsening Menorrhagia. Heavy bleeding with overflow and missing work.  Will investigate with a CBC and TSH today.  F/U for Pelvic US to evaluate for Fibroids, Polyps, Endometrial Hyperplasia and Endometrial Ca.  Management per results.  Considering Progesterone IUD, Progestin-only pill or Endometrial Ablation.  Pamphlets and information given.   - CBC - TSH - US Transvaginal Non-OB; Future  2. Acquired hypothyroidism Will check TSH today.  3. TOBACCO ABUSE Strongly recommended to quit.  CI to Estrogen containing BCPs.  Chyrel, it was a pleasure meeting you today!  I will inform you of your results as soon as they are available.

## 2018-09-20 ENCOUNTER — Encounter: Payer: Self-pay | Admitting: *Deleted

## 2018-09-20 LAB — CBC
HCT: 43 % (ref 35.0–45.0)
Hemoglobin: 14.5 g/dL (ref 11.7–15.5)
MCH: 30.9 pg (ref 27.0–33.0)
MCHC: 33.7 g/dL (ref 32.0–36.0)
MCV: 91.7 fL (ref 80.0–100.0)
MPV: 12.2 fL (ref 7.5–12.5)
PLATELETS: 291 10*3/uL (ref 140–400)
RBC: 4.69 10*6/uL (ref 3.80–5.10)
RDW: 12.3 % (ref 11.0–15.0)
WBC: 9.1 10*3/uL (ref 3.8–10.8)

## 2018-09-20 LAB — TSH: TSH: 4.83 mIU/L — ABNORMAL HIGH

## 2018-10-02 ENCOUNTER — Other Ambulatory Visit: Payer: Self-pay | Admitting: Obstetrics & Gynecology

## 2018-10-02 ENCOUNTER — Ambulatory Visit: Payer: 59 | Admitting: Obstetrics & Gynecology

## 2018-10-02 ENCOUNTER — Ambulatory Visit (INDEPENDENT_AMBULATORY_CARE_PROVIDER_SITE_OTHER): Payer: 59

## 2018-10-02 DIAGNOSIS — N9489 Other specified conditions associated with female genital organs and menstrual cycle: Secondary | ICD-10-CM | POA: Diagnosis not present

## 2018-10-02 DIAGNOSIS — N920 Excessive and frequent menstruation with regular cycle: Secondary | ICD-10-CM

## 2018-10-02 DIAGNOSIS — R9389 Abnormal findings on diagnostic imaging of other specified body structures: Secondary | ICD-10-CM

## 2018-10-02 DIAGNOSIS — N831 Corpus luteum cyst of ovary, unspecified side: Secondary | ICD-10-CM | POA: Diagnosis not present

## 2018-10-02 NOTE — Progress Notes (Signed)
    Valerie Bernard July 27, 1976 [id number]        43 y.o.  G3P3L3 married.  Vasectomy.  RP: Menorrhagia for Pelvic US  HPI: Heavy menstrual periods every month, worsening x  1 year.  No pelvic pain.  Normal vaginal secretions.     OB History  Gravida Para Term Preterm AB Living  3 3       3   SAB TAB Ectopic Multiple Live Births               # Outcome Date GA Lbr Len/2nd Weight Sex Delivery Anes PTL Lv  3 Para           2 Para           1 Para             Past medical history,surgical history, problem list, medications, allergies, family history and social history were all reviewed and documented in the EPIC chart.   Directed ROS with pertinent positives and negatives documented in the history of present illness/assessment and plan.  Exam:  There were no vitals filed for this visit. General appearance:  Normal  Pelvic US today: T/V images.  Uterus anteverted homogeneous measuring 10.49 x 6.99 x 5.33 cm.  Endometrial line thickened and prominent measuring 16.7 mm with a 2.2 x 1.4 cm focus.  Right ovary with a thick-walled collapsed corpus luteum cyst with color flow Doppler positive at the periphery measuring 2.1 x 1.8 cm.  Left ovary normal.  No free fluid in the posterior cul-de-sac.   Assessment/Plan:  43 y.o. G3P3   1. Menorrhagia with regular cycle Pelvic ultrasound findings reviewed with patient.  The uterus was normal except for thickened endometrial lining measuring 16.7 mm with a 2.2 x 1.4 cm focus.  Compatible with an endometrial polyp or submucosal myoma.  Ovaries within normal limits.  Follow-up for sonohysterogram to further investigate.  Procedure reviewed with patient. - [place] Sonohysterogram; Future  2. Thickened endometrium As above. - [place] Sonohysterogram; Future  Counseling on above issues and coordination of care more than 50% for 15 minutes.  Princess Bruins MD, 10:07 AM 10/02/2018

## 2018-10-03 ENCOUNTER — Encounter: Payer: Self-pay | Admitting: Obstetrics & Gynecology

## 2018-10-03 NOTE — Patient Instructions (Signed)
1. Menorrhagia with regular cycle Pelvic ultrasound findings reviewed with patient.  The uterus was normal except for thickened endometrial lining measuring 16.7 mm with a 2.2 x 1.4 cm focus.  Compatible with an endometrial polyp or submucosal myoma.  Ovaries within normal limits.  Follow-up for sonohysterogram to further investigate.  Procedure reviewed with patient. - [place] Sonohysterogram; Future  2. Thickened endometrium As above. - [place] Sonohysterogram; Future  Valerie Bernard, it was a pleasure seeing you today!

## 2018-10-26 ENCOUNTER — Other Ambulatory Visit: Payer: Self-pay

## 2018-10-26 ENCOUNTER — Ambulatory Visit (INDEPENDENT_AMBULATORY_CARE_PROVIDER_SITE_OTHER): Payer: 59

## 2018-10-26 ENCOUNTER — Ambulatory Visit: Payer: 59 | Admitting: Obstetrics & Gynecology

## 2018-10-26 DIAGNOSIS — R9389 Abnormal findings on diagnostic imaging of other specified body structures: Secondary | ICD-10-CM | POA: Diagnosis not present

## 2018-10-26 DIAGNOSIS — N920 Excessive and frequent menstruation with regular cycle: Secondary | ICD-10-CM

## 2018-10-26 DIAGNOSIS — N840 Polyp of corpus uteri: Secondary | ICD-10-CM

## 2018-10-26 NOTE — Progress Notes (Signed)
    Valerie Bernard 1976-07-01 [id number]        43 y.o.  G3P3L3 Married.  Vasectomy.  RP: Menorrhagia with thickened endometrial line for Sonohysterogram  HPI: Menorrhagia with thickened endometrial line at 16.7 mm by Pelvic US 10/02/2018.   OB History  Gravida Para Term Preterm AB Living  3 3       3   SAB TAB Ectopic Multiple Live Births               # Outcome Date GA Lbr Len/2nd Weight Sex Delivery Anes PTL Lv  3 Para           2 Para           1 Para             Past medical history,surgical history, problem list, medications, allergies, family history and social history were all reviewed and documented in the EPIC chart.   Directed ROS with pertinent positives and negatives documented in the history of present illness/assessment and plan.  Exam:  There were no vitals filed for this visit. General appearance:  Normal                                                                    Sono Infusion Hysterogram ( procedure note)   The initial transvaginal ultrasound demonstrated the following: T/V images.  Anteverted uterus measuring 9.12 x 6.46 x 5.1 cm with no fibroids seen.  Endometrial lining remains asymmetrically thickened at 16.1 mm.  Both ovaries are normal.  No adnexal mass.  No free fluid in the posterior cul-de-sac.  The speculum  was inserted and the cervix cleansed with Betadine solution after confirming that patient has no allergies.A small sonohysterography catheterwas utilized.  Insertion was facilitated with ring forceps, using a spear-like motion the catheter was inserted to the fundus of the uterus. The speculum is then removed carefully to avoid dislodging the catheter. The catheter was flushed with sterile saline delete prior to insertion to rid it of small amounts of air.the sterile saline solution was infused into the uterine cavity as a vaginal ultrasound probe was then placed in the vagina for full visualization of the uterine cavity from a transvaginal  approach. The following was noted: The endometrial cavity is filled revealing a 1.8 x 0.9 cm echogenic intracavitary mass.  The catheter was then removed after retrieving some of the saline from the intrauterine cavity. An endometrial biopsy was not done. Patient tolerated procedure well. She had received a tablet of Aleve for discomfort.    Assessment/Plan:  43 y.o. G3P3   1. Thickened endometrium Endometrial Polyp.  2. Endometrial polyp Endometrial Polyp 1.8 cm per Sonohysterogram. Information and pamphlet given.  Will schedule a hysteroscopy with Myosure excision and D&C.  F/U Preop.  Counseling on above issues and coordination of care more than 50% for 15 minutes.  Princess Bruins MD, 3:02 PM 10/26/2018

## 2018-10-29 ENCOUNTER — Encounter: Payer: Self-pay | Admitting: Obstetrics & Gynecology

## 2018-10-29 NOTE — Patient Instructions (Signed)
1. Thickened endometrium Endometrial Polyp.  2. Endometrial polyp Endometrial Polyp 1.8 cm per Sonohysterogram. Information and pamphlet given.  Will schedule a hysteroscopy with Myosure excision and D&C.  F/U Preop.  Valerie Bernard, it was a pleasure seeing you today!

## 2018-11-01 ENCOUNTER — Telehealth: Payer: Self-pay | Admitting: *Deleted

## 2018-11-01 NOTE — Telephone Encounter (Signed)
I left message for patient that there is a hold on scheduling non urgent surgeries for 4 weeks.  I advised pt in the VM that I will call her back in 2 weeks to begin her scheduling process or to give an update to the OR situation. Advised to call the office if she needs [place] before then. KW CMA

## 2018-11-22 ENCOUNTER — Encounter: Payer: Self-pay | Admitting: *Deleted

## 2018-11-27 ENCOUNTER — Encounter: Payer: Self-pay | Admitting: *Deleted

## 2018-11-27 NOTE — Progress Notes (Signed)
Letter mailed to pt regarding surgery scheduling since no reply by MyChart. KW CMA

## 2018-12-19 ENCOUNTER — Telehealth: Payer: Self-pay

## 2018-12-19 NOTE — Telephone Encounter (Signed)
Per DPR access note on file I left message for patient that I have checked her ins benefits and when she has time I would like to speak with her to review those and discuss financial arrangements.

## 2019-01-01 NOTE — Telephone Encounter (Signed)
Called patient again and asked her to call to schedule.

## 2019-02-06 ENCOUNTER — Other Ambulatory Visit: Payer: Self-pay

## 2019-02-06 ENCOUNTER — Ambulatory Visit: Payer: 59 | Attending: Physician Assistant | Admitting: Audiology

## 2019-02-06 DIAGNOSIS — H9193 Unspecified hearing loss, bilateral: Secondary | ICD-10-CM

## 2019-02-06 DIAGNOSIS — H906 Mixed conductive and sensorineural hearing loss, bilateral: Secondary | ICD-10-CM | POA: Diagnosis present

## 2019-02-06 DIAGNOSIS — H93213 Auditory recruitment, bilateral: Secondary | ICD-10-CM | POA: Insufficient documentation

## 2019-02-06 DIAGNOSIS — Z822 Family history of deafness and hearing loss: Secondary | ICD-10-CM | POA: Diagnosis present

## 2019-02-06 DIAGNOSIS — H93299 Other abnormal auditory perceptions, unspecified ear: Secondary | ICD-10-CM | POA: Diagnosis present

## 2019-02-06 DIAGNOSIS — H9325 Central auditory processing disorder: Secondary | ICD-10-CM

## 2019-02-06 NOTE — Procedures (Signed)
[facility] [address]  [place], [place] [address]  [phone]   Audiological and Auditory Processing  Evaluation  Patient Name: Valerie Bernard   Status: Outpatient   DOB: 10-Jul-1976    Diagnosis: Auditory Processing and Hearing Loss                 MRN: [id number]    Referent: Helper, PA, Ozan Attention Specialist Date:  02/06/2019     PCP: Hayden Rasmussen, MD  History: Aneka Fagerstrom was seen for an audiological evaluation.  Primary Concern: Auditory processing and concerns about hearing loss currently noticing in groups, at home and at work. Pain: None History of hearing problems: Y - aware of auditory processing for 10 years, more recently about the hearing loss.   History of ear infections:  Y - numerous from childhood on. The last major ear infection was in 2013. Most of the can "feel pressure in the ear and hear the fluid in the ear". History of ear surgery or "tubes" : N History of dizziness/vertigo:   Y - "diagnosed with vertigo about 20 years ago". Currently have slight vertigo twice a year. History of balance issues:  Y - for "15 years"  Tinnitus: Sometimes but not often "high pitched" Sound sensitivity: Y - has noticed since "little" with balloon popping noise, thunder and other loud noises. 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 History of hypertension: N History of diabetes:  N Family history of hearing loss:  Y - both parents have had hearing aids since 35's but had hearing loss before this time.     Evaluation: Conventional pure tone audiometry from 250Hz  - 8000Hz  with using insert earphones.  Hearing Thresholds show symmetrical hearing loss of 30 dBHL at 250Hz ; 20-25 dBHL at 500Hz , 1000Hz  and 2000Hz ; 30-35 dBHL at 3000Hz ; 45-50 dBHL at 4000Hz  and 60 dBHL at 8000Hz  bilaterally. Masked bone conduction shows a mixed low and high frequency loss bilaterally.Reliability is good Speech reception levels  (repeating words near threshold) using recorded spondee word lists:  Right ear: 30 dBHL.  Left ear:  30 dBHL Word recognition (at comfortably loud volumes) using recorded NU-6 word lists at 60 dBHL, in quiet.  Right ear: 88%.  Left ear:   96% Word recognition using recorded PBK word lists in minimal background noise:  +5 dBHL  Right ear: 46%                              Left ear:  56%  Tympanometry (middle ear function) with ipsilateral acoustic reflexes with negative 1000Hz  acoustic reflex decay bilaterally.  Right ear: Normal (Type A) with 90-95dB present acoustic reflex  from 500Hz  - 4000Hz .  Left ear: Normal (Type A) with 85-90dB present acoustic reflex from 500Hz  - 4000Hz . Distortion Product Otoacoustic Emissions (DPOAE's) were absent from 3000Hz  - 10,000Hz  bilaterally but were not recorded because this is consistent with the degree of hearing loss.  Retrocochlear testing appears negative bilaterally - Acoustic reflex decay is negative bilaterally.   Competing Sentences (CS) involved a different sentences being presented to each ear at different volumes. The instructions are to repeat the softer volume sentences. Posterior temporal issues will show poorer performance in the ear contralateral to the lobe involved.  Kelsa scored 80% in the right ear and 20% in the left ear.  The test results are abnormal in each ear, especially on the left side which is consistent  with Central Auditory Processing Disorder (CAPD) with poor binaural integration.  Dichotic Digits (DD) presents different two digits to each ear. All four digits are to be repeated. Poor performance suggests that cerebellar and/or brainstem may be involved. Brittnay scored 100% in the right ear and 86% in the left ear. The test results indicate that Malvern scored abnormal on the left side which is consistent with Central Auditory Processing Disorder (CAPD).  Musiek's Frequency (Pitch) Pattern Test requires identification of high  and low pitch tones presented each ear individually. Poor performance may occur with organization, learning issues or dyslexia.  Haydan scored 100% correct in each ear which is normal on this auditory processing test.  Uncomfortable Loudness Testing was performed using speech noise.  Tyshell reported that noise levels of 95 dBHL "hurt" when presented to each ear alone which is consistent with recruitment or sound sensitivity.   Summary of Saraphina's areas of difficulty: Tolerance-Fading Memory (TFM) is associated with both difficulties understanding speech in the presence of background noise and poor short-term auditory memory.  Difficulties are usually seen in attention span, reading, comprehension and inferences, following directions, poor handwriting, auditory figure-ground, short term memory, expressive and receptive language, inconsistent articulation, oral and written discourse, and problems with distractibility.  Poor Binaural Integration involves the ability to utilize two or more sensory modalities together. Typically, problems tying together auditory and visual information are seen which may adversely affect note-taking or copying. Severe reading, spelling, decoding, poor handwriting and dyslexia are common.  An occupational therapy evaluation is recommended.  Reduced Word Recognition in Minimal Background Noise is the inability to hear in the presence of competing noise. This problem may be easily mistaken for inattention.  Hearing may be excellent in a quiet room but become very poor when a fan, air conditioner or heater come on, paper is rattled or music is turned on. The background noise does not have to "sound loud" to a normal listener in order for it to be a problem for someone with an auditory processing disorder.    High Frequency Hearing Loss will create difficulty with speech perception because of the inability to hear several speech sounds such as /f,s,th,k and sh/ which will  adversely affect word recognition. There are other sounds that will be inaudible depending on the presence or absence of background noise, whether speaking face to face or the volume of the person speaking. This may create difficulty with being unaware of subtle conversational or missing portions of fast-paced peer interactions which contributes to fatigue due to extra effort needed for understanding.    CONCLUSIONS: Mehr Depaoli has a significant hearing loss bilaterally which requires referral to an Ear, Nose and Throat (ENT) specialist for further evaluation. She has a borderline normal to slight low frequency hearing loss with a mild sloping to moderately severe high frequency hearing loss bilaterally. The hearing loss appears mixed with some loudness recruitment. Middle ear function appears within normal limits.   Word recognition at normal conversational speech levels is good on the right and excellent on the left side in quiet. However, in minimal background noise Aynslee Fons's word recognition drops to very poor in each ear. It is expected that she will miss approximately 50% of what is said in most social and classroom settings, possibly more with fluctuating background noise. As discussed with Clotilde, there is an argument that all those with hearing loss have auditory processing issues. However, to further document areas of difficulty auditory processing tests were also completed which showed  Central Auditory Processing Disorder (CAPD) in the areas of Tolerance Fading Memory with poor binaural integration which supports the difficult that Ceil has hearing in background noise. The poor binaural integration component specifically address the difficulty that Keilana Morlock has processing auditory information when more than one thing is going on which may include difficulty with auditory-visual integration with response delays. In the adult word this will include difficulty listening and taking  notes at meetings and lectures. As discussed with Shirlee More, it is strongly recommended that prior to any meeting that she have a written agenda and that pre and post summary notes be provided to her.  Knowing topics and vocabulary in advance greatly benefits following topics and being aware of subject changes for those with hearing loss.  Following the ENT evaluation, hearing aids or amplification is strongly recommended. There are new technologies for improved hearing in background noise such as "[employer]" which are helpful to those listening working in difficult auditory areas.   RECOMMENDATIONS: 1.  Referral and further evaluation an Ear, Nose and Throat (ENT) specialist. In addition to the hearing loss, please evaluate Ileigh Pendley's history of balance/vestibular concerns.  2.  Following the ENT evaluation, referral to an audiologist for amplification with close monitoring of hearing to rule out a progressive hearing loss.  3.  Since Karlyn Glasco is employed and the cost of hearing aids can be significant, please contact [facility] for possible assistance with the cost of the amplification as well as guidance on a workplace 504 Plan.  Topics to include in a 504 Plan would be: A)  Providing written agenda prior to meetings B)  Pre and post summary notes of meetings to be provided to her. C)  Allowing technology including the recording of meetings for later review and possible transcription since those with hearing loss must focus on visual cues and lipreading which would be missed while trying to simultaneously taking notes. D) Optimize placement away from noise sources where Shirlee More can see and hear. One on one or small group meetings are best. Those speaking should try to take turns and not talk over each other since this will create background noise and degrade the speech signal.   4.  Strategies that help improve hearing in social/meeting settings  include: A) Face the speaker directly. Optimal is having the speakers face well - lit.  Unless amplified, being within 3-6 feet of the speaker will enhance word recognition. B) Avoid having the speaker back-lit as this will minimize the ability to use cues from lip-reading, facial expression and gestures. C)  Word recognition is poorer in background noise. For optimal word recognition, turn off the TV, radio or noisy fan when engaging in conversation. In a restaurant, try to sit away from noise sources and close to the primary speaker.  D)  Ask for topic clarification from time to time in order to remain in the conversation.  Most people don't mind repeating or clarifying a point when asked.  If needed, explain the difficulty hearing in background noise or hearing loss. E) Allow Shirlee More extra response time since it may take longer to process and understand a degraded speech.  5.  Encourage the use of technology to assist auditorily with hearing.  Using apps on the ipad/tablet or phone is an effective strategy. Use a recording device such as a smartpen or live scribe smart pen which records (and in some cases transcribes) while writing taking notes.  6.   Additional  strategies and recommendations: A) An excellent resource for hearing strategies in a variety of setting written by a middle ages women who developed hearing loss is an out of print book available on [employer]bay: Missing Words: The family Handbook on Adult Hearing loss by [employer]. B) A computer based program that for auditory training is LACE by [employer] may be considered after amplification.  Testing time: 60 minutes Total contact time: 90 minutes followed by report writing. More than 50% of the appointment was spent counseling and discussing diagnosis and management of symptoms.   Lyzette Reinhardt L. Heide Spark, [place], [place] 02/06/2019

## 2019-02-09 ENCOUNTER — Encounter: Payer: Self-pay | Admitting: Anesthesiology

## 2019-02-09 ENCOUNTER — Other Ambulatory Visit: Payer: Self-pay

## 2019-02-12 ENCOUNTER — Encounter: Payer: Self-pay | Admitting: Obstetrics & Gynecology

## 2019-02-12 ENCOUNTER — Ambulatory Visit: Payer: 59 | Admitting: Obstetrics & Gynecology

## 2019-02-12 ENCOUNTER — Other Ambulatory Visit: Payer: Self-pay

## 2019-02-12 VITALS — BP 132/80

## 2019-02-12 DIAGNOSIS — N840 Polyp of corpus uteri: Secondary | ICD-10-CM | POA: Diagnosis not present

## 2019-02-12 DIAGNOSIS — N920 Excessive and frequent menstruation with regular cycle: Secondary | ICD-10-CM | POA: Diagnosis not present

## 2019-02-12 NOTE — Patient Instructions (Signed)
1. Endometrial polyp Endometrial polyp per sonohysterogram done in March 2020 measuring 1.8 cm.  We will proceed with a hysteroscopy with MyoSure excision of the endometrial polyp and D&C.  Preop precautions, surgical procedure with risks and benefits as well as the postop management and expectations were discussed and reviewed with patient.  Patient informed that she will have a preop visit to screen for COVID 19.  Anesthesia choices briefly discussed.  Risk of uterine perforation explained and precautions taken to avoid it reviewed.  Patient informed that she will receive IV antibiotics prior to procedure to reduce the risk of infection.  She will have PAS inflatable devices on her legs to decrease the risk of DVT and pulmonary embolism.  Patient voiced understanding and agreement with plan.  2. Menorrhagia with regular cycle As above.  Valerie Bernard, it was a pleasure seeing you today!

## 2019-02-12 NOTE — Progress Notes (Signed)
    Valerie Bernard 1976/07/18 [id number]        43 y.o.  G3P3L3 Married.  Vasectomy  RP: Preop [facility]SC Myosure excision of Endometrial Polyp, D+C  HPI:  Menorrhagia.  No BTB.  No pelvic pain.  Vasectomy.  Sonohysto Endometrial Polyp 1.8 cm.   OB History  Gravida Para Term Preterm AB Living  3 3       3   SAB TAB Ectopic Multiple Live Births               # Outcome Date GA Lbr Len/2nd Weight Sex Delivery Anes PTL Lv  3 Para           2 Para           1 Para             Past medical history,surgical history, problem list, medications, allergies, family history and social history were all reviewed and documented in the EPIC chart.   Directed ROS with pertinent positives and negatives documented in the history of present illness/assessment and plan.  Exam:  Vitals:   02/12/19 1121  BP: 132/80   General appearance:  Normal  Sonohystero 10/26/2018:  The initial transvaginal ultrasound demonstrated the following: T/V images.  Anteverted uterus measuring 9.12 x 6.46 x 5.1 cm with no fibroids seen.  Endometrial lining remains asymmetrically thickened at 16.1 mm.  Both ovaries are normal.  No adnexal mass.  No free fluid in the posterior cul-de-sac.  The speculum  was inserted and the cervix cleansed with Betadine solution after confirming that patient has no allergies.A small sonohysterography catheterwas utilized.  Insertion was facilitated with ring forceps, using a spear-like motion the catheter was inserted to the fundus of the uterus. The speculum is then removed carefully to avoid dislodging the catheter. The catheter was flushed with sterile saline delete prior to insertion to rid it of small amounts of air.the sterile saline solution was infused into the uterine cavity as a vaginal ultrasound probe was then placed in the vagina for full visualization of the uterine cavity from a transvaginal approach. The following was noted: The endometrial cavity is filled revealing a 1.8 x 0.9  cm echogenic intracavitary mass.  The catheter was then removed after retrieving some of the saline from the intrauterine cavity. An endometrial biopsy was not done. Patient tolerated procedure well. She had received a tablet of Aleve for discomfort.    Assessment/Plan:  43 y.o. G3P3   1. Endometrial polyp Endometrial polyp per sonohysterogram done in March 2020 measuring 1.8 cm.  We will proceed with a hysteroscopy with MyoSure excision of the endometrial polyp and D&C.  Preop precautions, surgical procedure with risks and benefits as well as the postop management and expectations were discussed and reviewed with patient.  Patient informed that she will have a preop visit to screen for COVID 19.  Anesthesia choices briefly discussed.  Risk of uterine perforation explained and precautions taken to avoid it reviewed.  Patient informed that she will receive IV antibiotics prior to procedure to reduce the risk of infection.  She will have PAS inflatable devices on her legs to decrease the risk of DVT and pulmonary embolism.  Patient voiced understanding and agreement with plan.  2. Menorrhagia with regular cycle As above.  Counseling on above issues and coordination of care more than 50% for 15 minutes.  Princess Bruins MD, 11:49 AM 02/12/2019

## 2019-02-13 ENCOUNTER — Ambulatory Visit: Payer: 59 | Admitting: Obstetrics & Gynecology

## 2019-02-16 ENCOUNTER — Other Ambulatory Visit (HOSPITAL_COMMUNITY)
Admission: RE | Admit: 2019-02-16 | Discharge: 2019-02-16 | Disposition: A | Discharge status: Home / Self Care | Payer: 59 | Source: Ambulatory Visit | Attending: Obstetrics & Gynecology | Admitting: Obstetrics & Gynecology

## 2019-02-16 DIAGNOSIS — Z01812 Encounter for preprocedural laboratory examination: Secondary | ICD-10-CM | POA: Insufficient documentation

## 2019-02-16 DIAGNOSIS — Z1159 Encounter for screening for other viral diseases: Secondary | ICD-10-CM | POA: Insufficient documentation

## 2019-02-16 LAB — SARS CORONAVIRUS 2 (TAT 6-24 HRS): SARS Coronavirus 2: NEGATIVE

## 2019-02-19 ENCOUNTER — Other Ambulatory Visit: Payer: Self-pay

## 2019-02-19 ENCOUNTER — Encounter (HOSPITAL_BASED_OUTPATIENT_CLINIC_OR_DEPARTMENT_OTHER): Payer: Self-pay | Admitting: *Deleted

## 2019-02-19 NOTE — Progress Notes (Signed)
SPOKE W/  _ pt via phone     SCREENING SYMPTOMS OF COVID 19:   COUGH-- yes, per pt due to asthma  RUNNY NOSE--- no  SORE THROAT--- no  NASAL CONGESTION---- no  SNEEZING----  no  SHORTNESS OF BREATH--- no  DIFFICULTY BREATHING---  asthma  TEMP >100.0 ----- no  UNEXPLAINED BODY ACHES------ no  CHILLS --------  no  HEADACHES --------- no  LOSS OF SMELL/ TASTE --------  no    HAVE YOU OR ANY FAMILY MEMBER TRAVELLED PAST 14 DAYS OUT OF THE   COUNTY--- no STATE---- no COUNTRY---- no  HAVE YOU OR ANY FAMILY MEMBER BEEN EXPOSED TO ANYONE WITH COVID 19?   denies

## 2019-02-19 NOTE — Progress Notes (Addendum)
Spoke w/ pt via phone for pre-op interview.  Npo after mn.  Arrive at 0830.  Needs cbc and urine preg.  Will take synthroid and lamictal am dos w/ sips of water. Asked pt to bring rescue inhaler.  Pt had covid test done 02-16-2019.

## 2019-02-20 ENCOUNTER — Ambulatory Visit (HOSPITAL_BASED_OUTPATIENT_CLINIC_OR_DEPARTMENT_OTHER): Payer: 59 | Admitting: Anesthesiology

## 2019-02-20 ENCOUNTER — Encounter (HOSPITAL_BASED_OUTPATIENT_CLINIC_OR_DEPARTMENT_OTHER): Payer: Self-pay | Admitting: *Deleted

## 2019-02-20 ENCOUNTER — Encounter (HOSPITAL_BASED_OUTPATIENT_CLINIC_OR_DEPARTMENT_OTHER)
Admission: RE | Disposition: A | Discharge status: Home / Self Care | Payer: Self-pay | Source: Home / Self Care | Attending: Obstetrics & Gynecology

## 2019-02-20 ENCOUNTER — Ambulatory Visit (HOSPITAL_BASED_OUTPATIENT_CLINIC_OR_DEPARTMENT_OTHER)
Admission: RE | Admit: 2019-02-20 | Discharge: 2019-02-20 | Disposition: A | Discharge status: Home / Self Care | Payer: 59 | Attending: Obstetrics & Gynecology | Admitting: Obstetrics & Gynecology

## 2019-02-20 ENCOUNTER — Other Ambulatory Visit: Payer: Self-pay

## 2019-02-20 DIAGNOSIS — J45909 Unspecified asthma, uncomplicated: Secondary | ICD-10-CM | POA: Diagnosis not present

## 2019-02-20 DIAGNOSIS — Z791 Long term (current) use of non-steroidal anti-inflammatories (NSAID): Secondary | ICD-10-CM | POA: Insufficient documentation

## 2019-02-20 DIAGNOSIS — Z801 Family history of malignant neoplasm of trachea, bronchus and lung: Secondary | ICD-10-CM | POA: Insufficient documentation

## 2019-02-20 DIAGNOSIS — N840 Polyp of corpus uteri: Secondary | ICD-10-CM | POA: Diagnosis not present

## 2019-02-20 DIAGNOSIS — F17210 Nicotine dependence, cigarettes, uncomplicated: Secondary | ICD-10-CM | POA: Insufficient documentation

## 2019-02-20 DIAGNOSIS — F419 Anxiety disorder, unspecified: Secondary | ICD-10-CM | POA: Insufficient documentation

## 2019-02-20 DIAGNOSIS — R9389 Abnormal findings on diagnostic imaging of other specified body structures: Secondary | ICD-10-CM | POA: Diagnosis not present

## 2019-02-20 DIAGNOSIS — K219 Gastro-esophageal reflux disease without esophagitis: Secondary | ICD-10-CM | POA: Diagnosis not present

## 2019-02-20 DIAGNOSIS — Z8261 Family history of arthritis: Secondary | ICD-10-CM | POA: Insufficient documentation

## 2019-02-20 DIAGNOSIS — Z825 Family history of asthma and other chronic lower respiratory diseases: Secondary | ICD-10-CM | POA: Diagnosis not present

## 2019-02-20 DIAGNOSIS — N920 Excessive and frequent menstruation with regular cycle: Secondary | ICD-10-CM | POA: Diagnosis not present

## 2019-02-20 DIAGNOSIS — Z833 Family history of diabetes mellitus: Secondary | ICD-10-CM | POA: Insufficient documentation

## 2019-02-20 DIAGNOSIS — Z79899 Other long term (current) drug therapy: Secondary | ICD-10-CM | POA: Diagnosis not present

## 2019-02-20 DIAGNOSIS — E039 Hypothyroidism, unspecified: Secondary | ICD-10-CM | POA: Diagnosis not present

## 2019-02-20 DIAGNOSIS — F39 Unspecified mood [affective] disorder: Secondary | ICD-10-CM | POA: Insufficient documentation

## 2019-02-20 HISTORY — DX: Unspecified mood (affective) disorder: F39

## 2019-02-20 HISTORY — DX: Personal history of gestational diabetes: Z86.32

## 2019-02-20 HISTORY — DX: Family history of other specified conditions: Z84.89

## 2019-02-20 HISTORY — DX: Unspecified asthma, uncomplicated: J45.909

## 2019-02-20 HISTORY — DX: Gastro-esophageal reflux disease without esophagitis: K21.9

## 2019-02-20 HISTORY — DX: Hypothyroidism, unspecified: E03.9

## 2019-02-20 HISTORY — PX: DILATATION & CURETTAGE/HYSTEROSCOPY WITH MYOSURE: SHX6511

## 2019-02-20 LAB — CBC
HCT: 45.8 % (ref 36.0–46.0)
Hemoglobin: 15.4 g/dL — ABNORMAL HIGH (ref 12.0–15.0)
MCH: 31.2 pg (ref 26.0–34.0)
MCHC: 33.6 g/dL (ref 30.0–36.0)
MCV: 92.9 fL (ref 80.0–100.0)
Platelets: 265 10*3/uL (ref 150–400)
RBC: 4.93 MIL/uL (ref 3.87–5.11)
RDW: 13.3 % (ref 11.5–15.5)
WBC: 10 10*3/uL (ref 4.0–10.5)
nRBC: 0 % (ref 0.0–0.2)

## 2019-02-20 LAB — POCT PREGNANCY, URINE: Preg Test, Ur: NEGATIVE

## 2019-02-20 SURGERY — DILATATION & CURETTAGE/HYSTEROSCOPY WITH MYOSURE
Anesthesia: General | Site: Uterus

## 2019-02-20 MED ORDER — MEPERIDINE HCL 25 MG/ML IJ SOLN
6.2500 mg | INTRAMUSCULAR | Status: DC | PRN
  Filled 2019-02-20: qty 1

## 2019-02-20 MED ORDER — LACTATED RINGERS IV SOLN
INTRAVENOUS | Status: DC
  Administered 2019-02-20: 09:00:00 via INTRAVENOUS
  Filled 2019-02-20: qty 1000

## 2019-02-20 MED ORDER — MIDAZOLAM HCL 2 MG/2ML IJ SOLN
INTRAMUSCULAR | Status: AC
  Filled 2019-02-20: qty 2

## 2019-02-20 MED ORDER — LIDOCAINE 2% (20 MG/ML) 5 ML SYRINGE
INTRAMUSCULAR | Status: AC
  Filled 2019-02-20: qty 5

## 2019-02-20 MED ORDER — SODIUM CHLORIDE 0.9 % IR SOLN
Status: DC | PRN
  Administered 2019-02-20: 3000 mL

## 2019-02-20 MED ORDER — ONDANSETRON HCL 4 MG/2ML IJ SOLN
INTRAMUSCULAR | Status: DC | PRN
  Administered 2019-02-20: 4 mg via INTRAVENOUS

## 2019-02-20 MED ORDER — ONDANSETRON HCL 4 MG/2ML IJ SOLN
INTRAMUSCULAR | Status: AC
  Filled 2019-02-20: qty 2

## 2019-02-20 MED ORDER — CEFAZOLIN SODIUM-DEXTROSE 2-4 GM/100ML-% IV SOLN
INTRAVENOUS | Status: AC
  Filled 2019-02-20: qty 100

## 2019-02-20 MED ORDER — CEFAZOLIN SODIUM-DEXTROSE 2-4 GM/100ML-% IV SOLN
2.0000 g | INTRAVENOUS | Status: AC
  Administered 2019-02-20: 2 g via INTRAVENOUS
  Filled 2019-02-20: qty 100

## 2019-02-20 MED ORDER — DEXAMETHASONE SODIUM PHOSPHATE 10 MG/ML IJ SOLN
INTRAMUSCULAR | Status: AC
  Filled 2019-02-20: qty 1

## 2019-02-20 MED ORDER — LACTATED RINGERS IV SOLN
INTRAVENOUS | Status: DC
  Filled 2019-02-20: qty 1000

## 2019-02-20 MED ORDER — METOCLOPRAMIDE HCL 5 MG/ML IJ SOLN
10.0000 mg | Freq: Once | INTRAMUSCULAR | Status: DC | PRN
  Filled 2019-02-20: qty 2

## 2019-02-20 MED ORDER — SCOPOLAMINE 1 MG/3DAYS TD PT72
MEDICATED_PATCH | TRANSDERMAL | Status: AC
  Filled 2019-02-20: qty 1

## 2019-02-20 MED ORDER — LIDOCAINE 2% (20 MG/ML) 5 ML SYRINGE
INTRAMUSCULAR | Status: DC | PRN
  Administered 2019-02-20: 100 mg via INTRAVENOUS

## 2019-02-20 MED ORDER — FENTANYL CITRATE (PF) 100 MCG/2ML IJ SOLN
INTRAMUSCULAR | Status: AC
  Filled 2019-02-20: qty 2

## 2019-02-20 MED ORDER — LIDOCAINE HCL 1 % IJ SOLN
INTRAMUSCULAR | Status: DC | PRN
  Administered 2019-02-20: 20 mL

## 2019-02-20 MED ORDER — FENTANYL CITRATE (PF) 100 MCG/2ML IJ SOLN
25.0000 ug | INTRAMUSCULAR | Status: DC | PRN
  Filled 2019-02-20: qty 1

## 2019-02-20 MED ORDER — FENTANYL CITRATE (PF) 100 MCG/2ML IJ SOLN
INTRAMUSCULAR | Status: DC | PRN
  Administered 2019-02-20 (×2): 50 ug via INTRAVENOUS

## 2019-02-20 MED ORDER — KETOROLAC TROMETHAMINE 30 MG/ML IJ SOLN
INTRAMUSCULAR | Status: DC | PRN
  Administered 2019-02-20: 30 mg via INTRAVENOUS

## 2019-02-20 MED ORDER — PROPOFOL 10 MG/ML IV BOLUS
INTRAVENOUS | Status: AC
  Filled 2019-02-20: qty 20

## 2019-02-20 MED ORDER — DEXAMETHASONE SODIUM PHOSPHATE 10 MG/ML IJ SOLN
INTRAMUSCULAR | Status: DC | PRN
  Administered 2019-02-20: 10 mg via INTRAVENOUS

## 2019-02-20 MED ORDER — PROPOFOL 10 MG/ML IV BOLUS
INTRAVENOUS | Status: DC | PRN
  Administered 2019-02-20: 200 mg via INTRAVENOUS

## 2019-02-20 MED ORDER — MIDAZOLAM HCL 5 MG/5ML IJ SOLN
INTRAMUSCULAR | Status: DC | PRN
  Administered 2019-02-20: 2 mg via INTRAVENOUS

## 2019-02-20 MED ORDER — KETOROLAC TROMETHAMINE 30 MG/ML IJ SOLN
INTRAMUSCULAR | Status: AC
  Filled 2019-02-20: qty 1

## 2019-02-20 SURGICAL SUPPLY — 23 items
CANISTER SUCT 3000ML PPV (MISCELLANEOUS) ×1 IMPLANT
CATH ROBINSON RED A/P 16FR (CATHETERS) ×2 IMPLANT
COVER WAND RF STERILE (DRAPES) ×2 IMPLANT
DEVICE MYOSURE LITE (MISCELLANEOUS) IMPLANT
DEVICE MYOSURE REACH (MISCELLANEOUS) ×1 IMPLANT
DILATOR CANAL MILEX (MISCELLANEOUS) IMPLANT
ELECT REM PT RETURN 9FT ADLT (ELECTROSURGICAL) ×2
ELECTRODE REM PT RTRN 9FT ADLT (ELECTROSURGICAL) IMPLANT
GAUZE 4X4 16PLY RFD (DISPOSABLE) ×2 IMPLANT
GLOVE BIO SURGEON STRL SZ 6.5 (GLOVE) ×2 IMPLANT
GLOVE BIOGEL PI IND STRL 7.0 (GLOVE) ×2 IMPLANT
GLOVE BIOGEL PI INDICATOR 7.0 (GLOVE) ×2
GOWN STRL REUS W/TWL LRG LVL3 (GOWN DISPOSABLE) ×4 IMPLANT
IV NS IRRIG 3000ML ARTHROMATIC (IV SOLUTION) ×3 IMPLANT
KIT PROCEDURE FLUENT (KITS) ×2 IMPLANT
MYOSURE XL FIBROID (MISCELLANEOUS)
PACK VAGINAL MINOR WOMEN LF (CUSTOM PROCEDURE TRAY) ×2 IMPLANT
PAD OB MATERNITY 4.3X12.25 (PERSONAL CARE ITEMS) ×2 IMPLANT
PAD PREP 24X48 CUFFED NSTRL (MISCELLANEOUS) ×2 IMPLANT
SEAL CERVICAL OMNI LOK (ABLATOR) IMPLANT
SEAL ROD LENS SCOPE MYOSURE (ABLATOR) ×2 IMPLANT
SYSTEM TISS REMOVAL MYOSURE XL (MISCELLANEOUS) IMPLANT
TOWEL OR 17X26 10 PK STRL BLUE (TOWEL DISPOSABLE) ×3 IMPLANT

## 2019-02-20 NOTE — Anesthesia Preprocedure Evaluation (Signed)
Anesthesia Evaluation  Patient identified by MRN, date of birth, ID band Patient awake    Reviewed: Allergy & Precautions, NPO status , Patient's Chart, lab work & pertinent test results  Airway Mallampati: II  TM Distance: >3 FB Neck ROM: Full    Dental no notable dental hx.    Pulmonary asthma , Current Smoker,    Pulmonary exam normal breath sounds clear to auscultation       Cardiovascular negative cardio ROS Normal cardiovascular exam Rhythm:Regular Rate:Normal     Neuro/Psych negative neurological ROS  negative psych ROS   GI/Hepatic Neg liver ROS, GERD  Controlled,  Endo/Other  Hypothyroidism   Renal/GU negative Renal ROS  negative genitourinary   Musculoskeletal negative musculoskeletal ROS (+)   Abdominal   Peds negative pediatric ROS (+)  Hematology negative hematology ROS (+)   Anesthesia Other Findings   Reproductive/Obstetrics negative OB ROS                             Anesthesia Physical Anesthesia Plan  ASA: II  Anesthesia Plan: General   Post-op Pain Management:    Induction: Intravenous  PONV Risk Score and Plan: 3 and Ondansetron, Dexamethasone, Treatment may vary due to age or medical condition and Midazolam  Airway Management Planned: LMA  Additional Equipment:   Intra-op Plan:   Post-operative Plan:   Informed Consent: I have reviewed the patients History and Physical, chart, labs and discussed the procedure including the risks, benefits and alternatives for the proposed anesthesia with the patient or authorized representative who has indicated his/her understanding and acceptance.     Dental advisory given  Plan Discussed with: CRNA  Anesthesia Plan Comments:         Anesthesia Quick Evaluation

## 2019-02-20 NOTE — Anesthesia Procedure Notes (Signed)
Procedure Name: LMA Insertion Date/Time: 02/20/2019 10:28 AM Performed by: Bonney Aid, CRNA Pre-anesthesia Checklist: Patient identified, Emergency Drugs available, Suction available and Patient being monitored Patient Re-evaluated:Patient Re-evaluated prior to induction Oxygen Delivery Method: Circle system utilized Preoxygenation: Pre-oxygenation with 100% oxygen Induction Type: IV induction Ventilation: Mask ventilation without difficulty LMA: LMA inserted LMA Size: 4.0 Number of attempts: 1 Airway Equipment and Method: Bite block Placement Confirmation: positive ETCO2 Tube secured with: Tape Dental Injury: Teeth and Oropharynx as per pre-operative assessment

## 2019-02-20 NOTE — Transfer of Care (Signed)
Immediate Anesthesia Transfer of Care Note  Patient: Valerie Bernard  Procedure(s) Performed: DILATATION & CURETTAGE/HYSTEROSCOPY WITH MYOSURE (N/A Uterus)  Patient Location: PACU  Anesthesia Type:General  Level of Consciousness: awake, alert  and oriented  Airway & Oxygen Therapy: Patient Spontanous Breathing and Patient connected to nasal cannula oxygen  Post-op Assessment: Report given to RN  Post vital signs: Reviewed and stable  Last Vitals: 127/71, 74, 20, 100%, 97.9 Vitals Value Taken Time  BP    Temp    Pulse    Resp    SpO2      Last Pain: There were no vitals filed for this visit.       Complications: No apparent anesthesia complications

## 2019-02-20 NOTE — Op Note (Signed)
Operative Note  02/20/2019  10:58 AM  PATIENT:  Valerie Bernard  43 y.o. female  PRE-OPERATIVE DIAGNOSIS:  Endometrial polyp  POST-OPERATIVE DIAGNOSIS:  Endometrial polyp, thickened endometrium  PROCEDURE:  Procedure(s): HYSTEROSCOPY WITH MYOSURE EXCISION OF ENDOMETRIAL POLYP AND THICKENED ENDOMETRIUM/DILATATION & CURETTAGE  SURGEON:  Surgeon(s): Princess Bruins, MD  ANESTHESIA:   general  FINDINGS: Endometrial polyp from anterior fundus about 1.8 cm and thickened endometrium around the polyp.  Otherwise normal endometrial cavity with ostia well seen.  DESCRIPTION OF OPERATION: Under general anesthesia with laryngeal mask, the patient is in lithotomy position.  She is prepped with Betadine on the suprapubic, vulvar and vaginal areas.  She is draped as usual.  The bladder is catheterized.  Timeout is done.  The vaginal exam reveals an anteverted uterus, normal volume, mobile.  No adnexal mass.  The speculum is inserted in the vagina and the anterior lip of the cervix was grasped with a tenaculum.  A paracervical block is done with lidocaine 1% a total of 20 cc at 4 and 8:00.  Dilation of the cervix with Pratt dilators up to #21 without difficulty.  Insertion of the hysteroscope in the intrauterine cavity.  Inspection of the intrauterine cavity reveals an anterior fundal polyp measuring about 1.8 cm as well as thickening of the endometrium around the polyp.  Both ostia are well seen.  The rest of the intrauterine cavity is normal.  The reach MyoSure is used for excision of the endometrial polyp and resection of the thickened endometrium.  Pictures were taken before excision and resection.  Hemostasis is adequate.  Pictures are taken after the excision and resection.  The MyoSure instrument is removed with the hysteroscope.  We proceed with a systematic curettage of the intrauterine cavity on all surfaces with a sharp curette.  The curette is removed from the intrauterine cavity.  Both specimen are  sent together to pathology.  The tenaculum is removed from the anterior lip of the cervix.  Hemostasis is adequate.  The speculum is removed.  The patient is brought to recovery room in good and stable status.  ESTIMATED BLOOD LOSS: 10 mL FLUID DEFICIT 140 cc  Intake/Output Summary (Last 24 hours) at 02/20/2019 1058 Last data filed at 02/20/2019 1050 Gross per 24 hour  Intake -  Output 10 ml  Net -10 ml     BLOOD ADMINISTERED:none   LOCAL MEDICATIONS USED:  LIDOCAINE 1% 20 cc for paracervical block  SPECIMEN:  Source of Specimen:  Excision/resection material endometrial polyp and thickened endometrium, Endometrial curettings.  DISPOSITION OF SPECIMEN:  PATHOLOGY  COUNTS:  YES  PLAN OF CARE: Transfer to PACU  Marie-Lyne LavoieMD10:58 AM

## 2019-02-20 NOTE — Anesthesia Postprocedure Evaluation (Signed)
Anesthesia Post Note  Patient: Valerie Bernard  Procedure(s) Performed: DILATATION & CURETTAGE/HYSTEROSCOPY WITH MYOSURE (N/A Uterus)     Patient location during evaluation: PACU Anesthesia Type: General Level of consciousness: awake and alert Pain management: pain level controlled Vital Signs Assessment: post-procedure vital signs reviewed and stable Respiratory status: spontaneous breathing, nonlabored ventilation, respiratory function stable and patient connected to nasal cannula oxygen Cardiovascular status: blood pressure returned to baseline and stable Postop Assessment: no apparent nausea or vomiting Anesthetic complications: no    Last Vitals:  Vitals:   02/20/19 1145 02/20/19 1235  BP: 115/78 128/77  Pulse: (!) 59 (!) 58  Resp: 18 14  Temp:  36.6 C  SpO2: 97% 98%    Last Pain:  Vitals:   02/20/19 1234  PainSc: 0-No pain                 Montez Hageman

## 2019-02-20 NOTE — H&P (Signed)
Valerie Bernard is an 43 y.o. female  G3P3L3 Married.  Vasectomy  RP: [place] Myosure excision of Endometrial Polyp, D+C  HPI:  Menorrhagia.  No BTB.  No pelvic pain.  Vasectomy.  Sonohysto Endometrial Polyp 1.8 cm.  Pertinent Gynecological History: Menses: flow is moderate currently Contraception: vasectomy Blood transfusions: none Sexually transmitted diseases: no past history Last mammogram: normal  Last pap: normal    Menstrual History: Patient's last menstrual period was 01/22/2019.    Past Medical History:  Diagnosis Date  . Anxiety   . Asthma   . Family history of adverse reaction to anesthesia    mother-- ponv  . GERD (gastroesophageal reflux disease)    no meds  . History of gestational diabetes   . Hypothyroidism    followed by pcp  . Mood disorder [facility])     Past Surgical History:  Procedure Laterality Date  . NO PAST SURGERIES      Family History  Problem Relation Age of Onset  . Asthma Mother   . Diabetes Brother   . Arthritis Maternal Grandmother   . Arthritis Maternal Grandfather   . Cancer Paternal Grandfather        lung  . Asthma Daughter   . Allergies Daughter   . Asthma Son     Social History:  reports that she has been smoking cigarettes. She has a 10.00 pack-year smoking history. She has never used smokeless tobacco. She reports current alcohol use of about 14.0 standard drinks of alcohol per week. She reports previous drug use.  Allergies: No Known Allergies  Medications Prior to Admission  Medication Sig Dispense Refill Last Dose  . ALBUTEROL IN Inhale into the lungs as needed.   02/19/2019 at Unknown time  . amphetamine-dextroamphetamine (ADDERALL XR) 20 MG 24 hr capsule Take 20 mg by mouth daily.   02/19/2019 at Unknown time  . clonazePAM (KLONOPIN) 0.5 MG tablet Take 0.25-0.5 mg by mouth 2 (two) times daily as needed for anxiety.  0 02/19/2019 at Unknown time  . ibuprofen (ADVIL,MOTRIN) 200 MG tablet Take 400 mg by mouth every 6 (six) hours  as needed for headache or mild pain.   Past Week at Unknown time  . lamoTRIgine (LAMICTAL) 100 MG tablet Take 200 mg by mouth daily.   3 02/19/2019 at Unknown time  . levothyroxine (SYNTHROID, LEVOTHROID) 200 MCG tablet Take 1 tablet (200 mcg total) by mouth daily before breakfast. (Patient taking differently: Take 200 mcg by mouth daily before breakfast. ) 90 tablet 1 02/19/2019 at Unknown time    REVIEW OF SYSTEMS: A ROS was performed and pertinent positives and negatives are included in the history.  GENERAL: No fevers or chills. HEENT: No change in vision, no earache, sore throat or sinus congestion. NECK: No pain or stiffness. CARDIOVASCULAR: No chest pain or pressure. No palpitations. PULMONARY: No shortness of breath, cough or wheeze. GASTROINTESTINAL: No abdominal pain, nausea, vomiting or diarrhea, melena or bright red blood per rectum. GENITOURINARY: No urinary frequency, urgency, hesitancy or dysuria. MUSCULOSKELETAL: No joint or muscle pain, no back pain, no recent trauma. DERMATOLOGIC: No rash, no itching, no lesions. ENDOCRINE: No polyuria, polydipsia, no heat or cold intolerance. No recent change in weight. HEMATOLOGICAL: No anemia or easy bruising or bleeding. NEUROLOGIC: No headache, seizures, numbness, tingling or weakness. PSYCHIATRIC: No depression, no loss of interest in normal activity or change in sleep pattern.     Blood pressure (!) 137/94, pulse 76, temperature 98 F (36.7 C), resp. rate 18, height  5\' 4"  (1.626 m), weight 98.4 kg, last menstrual period 01/22/2019, SpO2 98 %.  Physical Exam:  See office notes   Results for orders placed or performed during the hospital encounter of 02/20/19 (from the past 24 hour(s))  Pregnancy, urine POC     Status: None   Collection Time: 02/20/19  8:35 AM  Result Value Ref Range   Preg Test, Ur NEGATIVE NEGATIVE  CBC     Status: Abnormal   Collection Time: 02/20/19  8:45 AM  Result Value Ref Range   WBC 10.0 4.0 - 10.5 K/uL   RBC  4.93 3.87 - 5.11 MIL/uL   Hemoglobin 15.4 (H) 12.0 - 15.0 g/dL   HCT 45.8 36.0 - 46.0 %   MCV 92.9 80.0 - 100.0 fL   MCH 31.2 26.0 - 34.0 pg   MCHC 33.6 30.0 - 36.0 g/dL   RDW 13.3 11.5 - 15.5 %   Platelets 265 150 - 400 K/uL   nRBC 0.0 0.0 - 0.2 %    Sonohystero 10/26/2018:  The initial transvaginal ultrasound demonstrated the following: T/V images. Anteverted uterus measuring 9.12 x 6.46 x 5.1 cm with no fibroids seen. Endometrial lining remains asymmetrically thickened at 16.1 mm. Both ovaries are normal. No adnexal mass. No free fluid in the posterior cul-de-sac.  The speculum was inserted and the cervix cleansed with Betadine solution after confirming that patient has no allergies.A small sonohysterography catheterwas utilized. Insertion was facilitated with ring forceps, using a spear-like motion the catheter was inserted to the fundus of the uterus. The speculum is then removed carefully to avoid dislodging the catheter. The catheter was flushed with sterile saline delete prior to insertion to rid it of small amounts of air.the sterile saline solution was infused into the uterine cavity as a vaginal ultrasound probe was then placed in the vagina for full visualization of the uterine cavity from a transvaginal approach. The following was noted: The endometrial cavity is filled revealing a 1.8 x 0.9 cm echogenic intracavitary mass.  The catheter was then removed after retrieving some of the saline from the intrauterine cavity. An endometrial biopsywas notdone. Patient tolerated procedure well. She had received a tablet of Aleve for discomfort.   Assessment/Plan:  43 y.o. G3P3   1. Endometrial polyp Endometrial polyp per sonohysterogram done in March 2020 measuring 1.8 cm.  We will proceed with a hysteroscopy with MyoSure excision of the endometrial polyp and D&C.  Preop precautions, surgical procedure with risks and benefits as well as the postop management and expectations  were discussed and reviewed with patient.  Patient informed that she will have a preop visit to screen for COVID 19.  Anesthesia choices briefly discussed.  Risk of uterine perforation explained and precautions taken to avoid it reviewed.  Patient informed that she will receive IV antibiotics prior to procedure to reduce the risk of infection.  She will have PAS inflatable devices on her legs to decrease the risk of DVT and pulmonary embolism.  Patient voiced understanding and agreement with plan.  2. Menorrhagia with regular cycle As above.  Marie-Lyne Ajene Carchi 02/20/2019, 10:08 AM

## 2019-02-20 NOTE — Discharge Instructions (Addendum)
Post Anesthesia Home Care Instructions  Activity: Get plenty of rest for the remainder of the day. A responsible individual must stay with you for 24 hours following the procedure.  For the next 24 hours, DO NOT: -Drive a car -Paediatric nurse -Drink alcoholic beverages -Take any medication unless instructed by your physician -Make any legal decisions or sign important papers.  Meals: Start with liquid foods such as gelatin or soup. Progress to regular foods as tolerated. Avoid greasy, spicy, heavy foods. If nausea and/or vomiting occur, drink only clear liquids until the nausea and/or vomiting subsides. Call your physician if vomiting continues.  Special Instructions/Symptoms: Your throat may feel dry or sore from the anesthesia or the breathing tube placed in your throat during surgery. If this causes discomfort, gargle with warm salt water. The discomfort should disappear within 24 hours.  If you had a scopolamine patch placed behind your ear for the management of post- operative nausea and/or vomiting:  1. The medication in the patch is effective for 72 hours, after which it should be removed.  Wrap patch in a tissue and discard in the trash. Wash hands thoroughly with soap and water. 2. You may remove the patch earlier than 72 hours if you experience unpleasant side effects which may include dry mouth, dizziness or visual disturbances. 3. Avoid touching the patch. Wash your hands with soap and water after contact with the patch.    Hysteroscopy, Care After This sheet gives you information about how to care for yourself after your procedure. Your health care provider may also give you more specific instructions. If you have problems or questions, contact your health care provider. What can I expect after the procedure? After the procedure, it is common to have:  Cramping.  Bleeding. This can vary from light spotting to menstrual-like bleeding. Follow these instructions at  home: Activity  Rest for 1-2 days after the procedure.  Do not douche, use tampons, or have sex for 2 weeks after the procedure, or until your health care provider approves.  Do not drive for 24 hours after the procedure, or for as long as told by your health care provider.  Do not drive, use heavy machinery, or drink alcohol while taking prescription pain medicines. Medicines   Take over-the-counter and prescription medicines only as told by your health care provider.  Do not take aspirin during recovery. It can increase the risk of bleeding. General instructions  Do not take baths, swim, or use a hot tub until your health care provider approves. Take showers instead of baths for 2 weeks, or for as long as told by your health care provider.  To prevent or treat constipation while you are taking prescription pain medicine, your health care provider may recommend that you: ? Drink enough fluid to keep your urine clear or pale yellow. ? Take over-the-counter or prescription medicines. ? Eat foods that are high in fiber, such as fresh fruits and vegetables, whole grains, and beans. ? Limit foods that are high in fat and processed sugars, such as fried and sweet foods.  Keep all follow-up visits as told by your health care provider. This is important. Contact a health care provider if:  You feel dizzy or lightheaded.  You feel nauseous.  You have abnormal vaginal discharge.  You have a rash.  You have pain that does not get better with medicine.  You have chills. Get help right away if:  You have bleeding that is heavier than a normal menstrual  period.  You have a fever.  You have pain or cramps that get worse.  You develop new abdominal pain.  You faint.  You have pain in your shoulders.  You have shortness of breath. Summary  After the procedure, you may have cramping and some vaginal bleeding.  Do not douche, use tampons, or have sex for 2 weeks after the  procedure, or until your health care provider approves.  Do not take baths, swim, or use a hot tub until your health care provider approves. Take showers instead of baths for 2 weeks, or for as long as told by your health care provider.  Report any unusual symptoms to your health care provider.  Keep all follow-up visits as told by your health care provider. This is important. This information is not intended to replace advice given to you by your health care provider. Make sure you discuss any questions you have with your health care provider. Document Released: 05/23/2013 Document Revised: 07/15/2017 Document Reviewed: 08/31/2016 Elsevier Patient Education  2020 [employer].

## 2019-02-21 ENCOUNTER — Encounter (HOSPITAL_BASED_OUTPATIENT_CLINIC_OR_DEPARTMENT_OTHER): Payer: Self-pay | Admitting: Obstetrics & Gynecology

## 2019-05-02 ENCOUNTER — Other Ambulatory Visit: Payer: Self-pay

## 2019-05-02 ENCOUNTER — Encounter: Payer: Self-pay | Admitting: Obstetrics & Gynecology

## 2019-05-02 ENCOUNTER — Ambulatory Visit (INDEPENDENT_AMBULATORY_CARE_PROVIDER_SITE_OTHER): Payer: Commercial Managed Care - PPO | Admitting: Obstetrics & Gynecology

## 2019-05-02 VITALS — BP 126/80

## 2019-05-02 DIAGNOSIS — Z09 Encounter for follow-up examination after completed treatment for conditions other than malignant neoplasm: Secondary | ICD-10-CM

## 2019-05-02 DIAGNOSIS — N920 Excessive and frequent menstruation with regular cycle: Secondary | ICD-10-CM | POA: Diagnosis not present

## 2019-05-02 MED ORDER — NORETHINDRONE 0.35 MG PO TABS: 1.0000 | ORAL_TABLET | Freq: Every day | ORAL | 4 refills | Status: DC

## 2019-05-02 NOTE — Progress Notes (Signed)
    Valerie Bernard 1976/05/25 [id number]        43 y.o.  G3P3L3 Husband Vasectomized  RP: Postop [place] Myosure Excision/D+C 02/20/2019  HPI: Well since surgery with no pelvic pain, no vaginal discharge, no fever.  Menses still heavy after surgery, LMP 04/23/2019.  No BTB.     OB History  Gravida Para Term Preterm AB Living  3 3       3   SAB TAB Ectopic Multiple Live Births               # Outcome Date GA Lbr Len/2nd Weight Sex Delivery Anes PTL Lv  3 Para           2 Para           1 Para             Past medical history,surgical history, problem list, medications, allergies, family history and social history were all reviewed and documented in the EPIC chart.   Directed ROS with pertinent positives and negatives documented in the history of present illness/assessment and plan.  Exam:  Vitals:   05/02/19 1558  BP: 126/80   General appearance:  Normal  Abdomen: Normal  Gynecologic exam: Vulva normal.  Bimanual exam:  Uterus AV, normal volume, mobile, NT.  No adnexal mass, NT.  Diagnosis 02/20/2019 Endometrium, curettage, and polyp  - ENDOMETRIAL POLYP. SECRETORY ENDOMETRIUM. NO MALIGNANCY IDENTIFIED.         Assessment/Plan:  42 y.o. G3P3   1. Status post gynecological surgery, follow-up exam Good postop evolution with no complication.  Pathology benign, reviewed with patient.  2. Menorrhagia with regular cycle Given that patient's menses remain heavy after excision of the endometrial polyp, decision to control the cycle with a progestin pill.  No contraindication to the progestin pill.  Usage reviewed and prescription sent to the pharmacy.  Other orders - norethindrone (MICRONOR) 0.35 MG tablet; Take 1 tablet (0.35 mg total) by mouth daily.  F/U Annual Gyn exam.  Counseling on above issues and coordination of care more than 50% for 15 minutes.  Princess Bruins MD, 4:10 PM 05/02/2019

## 2019-05-06 ENCOUNTER — Encounter: Payer: Self-pay | Admitting: Obstetrics & Gynecology

## 2019-05-06 NOTE — Patient Instructions (Signed)
1. Status post gynecological surgery, follow-up exam Good postop evolution with no complication.  Pathology benign, reviewed with patient.  2. Menorrhagia with regular cycle Given that patient's menses remain heavy after excision of the endometrial polyp, decision to control the cycle with a progestin pill.  No contraindication to the progestin pill.  Usage reviewed and prescription sent to the pharmacy.  Other orders - norethindrone (MICRONOR) 0.35 MG tablet; Take 1 tablet (0.35 mg total) by mouth daily.  F/U Annual Gyn exam.  Kye, it was a pleasure seeing you today!

## 2019-07-09 ENCOUNTER — Encounter: Payer: Commercial Managed Care - PPO | Admitting: Obstetrics & Gynecology

## 2019-10-13 ENCOUNTER — Ambulatory Visit: Payer: Self-pay | Attending: Internal Medicine

## 2019-10-13 DIAGNOSIS — Z23 Encounter for immunization: Secondary | ICD-10-CM | POA: Insufficient documentation

## 2019-10-13 NOTE — Progress Notes (Signed)
   Covid-19 Vaccination Clinic  Name:  Valerie Bernard    MRN: [id number] DOB: 10/26/1975  10/13/2019  [name] was observed post Covid-19 immunization for 15 minutes without incidence. She was provided with Vaccine Information Sheet and instruction to access the V-Safe system.   [name] was instructed to call 911 with any severe reactions post vaccine: Marland Kitchen Difficulty breathing  . Swelling of your face and throat  . A fast heartbeat  . A bad rash all over your body  . Dizziness and weakness    Immunizations Administered    Name Date Dose VIS Date Route   Pfizer COVID-19 Vaccine 10/13/2019  4:28 PM 0.3 mL 07/27/2019 Intramuscular   Manufacturer: [place]   Lot: [id number]   [place]: [id number]

## 2019-11-03 ENCOUNTER — Ambulatory Visit: Payer: Self-pay | Attending: Internal Medicine

## 2019-11-03 DIAGNOSIS — Z23 Encounter for immunization: Secondary | ICD-10-CM

## 2019-11-03 NOTE — Progress Notes (Signed)
   Covid-19 Vaccination Clinic  Name:  Valerie Bernard    MRN: [id number] DOB: 10/27/1975  11/03/2019  [name] was observed post Covid-19 immunization for 15 minutes without incident. She was provided with Vaccine Information Sheet and instruction to access the V-Safe system.   [name] was instructed to call 911 with any severe reactions post vaccine: Marland Kitchen Difficulty breathing  . Swelling of face and throat  . A fast heartbeat  . A bad rash all over body  . Dizziness and weakness   Immunizations Administered    Name Date Dose VIS Date Route   Pfizer COVID-19 Vaccine 11/03/2019  8:58 AM 0.3 mL 07/27/2019 Intramuscular   Manufacturer: [place]   Lot: [id number]   [place]: [id number]

## 2020-02-19 IMAGING — MR MR HEAD W/O CM
10 series · 45 of 48 positions shown · non-contrast
Comparison: None.

CLINICAL DATA: Transient aphasia and dysarthria.

EXAM:
MRI HEAD WITHOUT CONTRAST
TECHNIQUE: Multiplanar, multiecho pulse sequences of the brain and surrounding
structures were obtained without intravenous contrast.

[Series 2: T1 · sagittal · 5.0mm · 0.45mm/px · 3 of 25 slices shown]
[im 1/25]
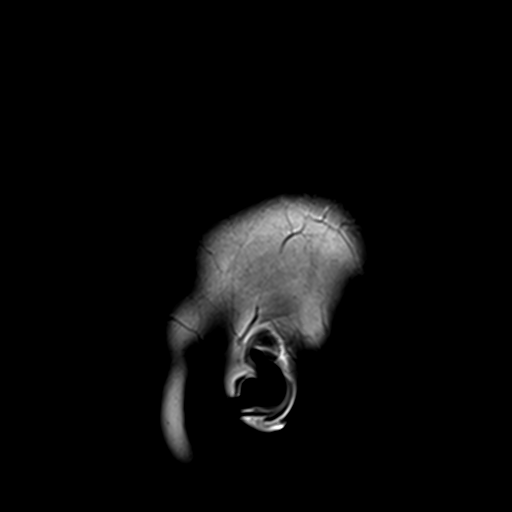
[im 13/25]
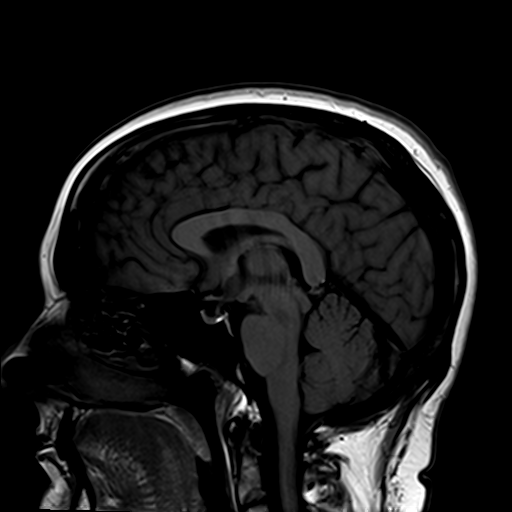
[im 25/25]
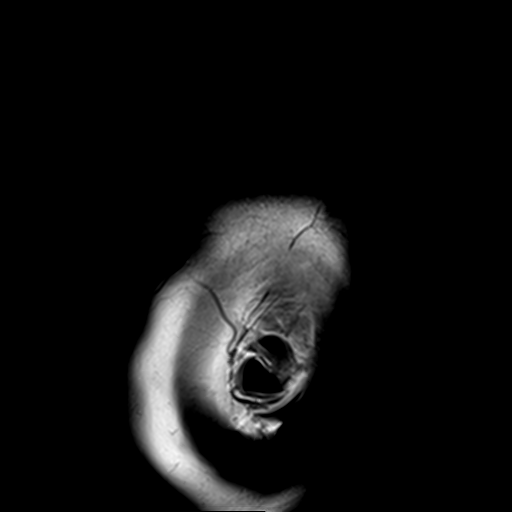

[Series 3: DWI · axial · 3.0mm · 1.80mm/px · z∈[-45,+101]mm · 8 of 100 slices shown (1 of 4)]
[im 1/100]
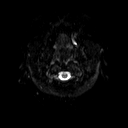
[im 15/100]
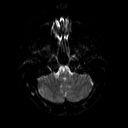
[im 29/100]
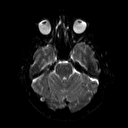
[im 43/100]
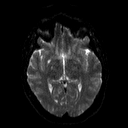
[im 57/100]
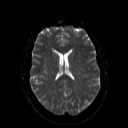
[im 71/100]
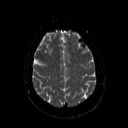
[im 85/100]
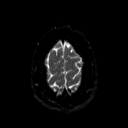
[im 100/100]
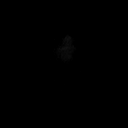

[Series 4: DWI · axial · 3.0mm · 1.80mm/px · z∈[-45,+101]mm · 4 of 50 slices shown (2 of 4)]
[im 1/50]
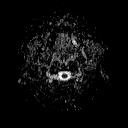
[im 17/50]
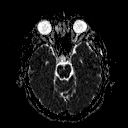
[im 33/50]
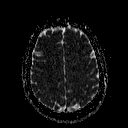
[im 50/50]
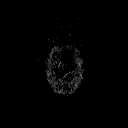

[Series 6: swi_images · axial · 2.0mm · 0.90mm/px · z∈[-56,+101]mm · 6 of 80 slices shown]
[im 1/80]
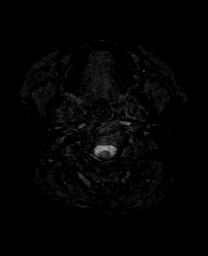
[im 16/80]
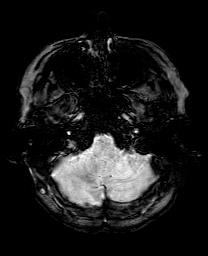
[im 32/80]
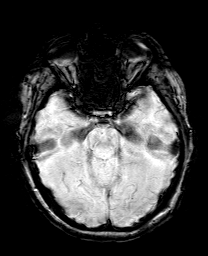
[im 48/80]
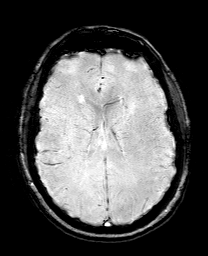
[im 64/80]
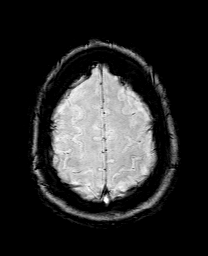
[im 80/80]
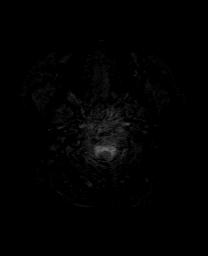

[Series 7: DWI · coronal · 5.0mm · 1.80mm/px · 5 of 67 slices shown (3 of 4)]
[im 1/67]
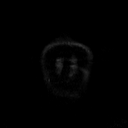
[im 17/67]
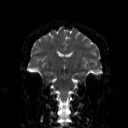
[im 34/67]
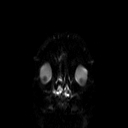
[im 50/67]
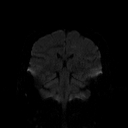
[im 67/67]
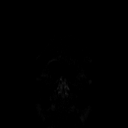

[Series 8: DWI · coronal · 5.0mm · 1.80mm/px · 3 of 35 slices shown (4 of 4)]
[im 1/35]
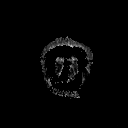
[im 18/35]
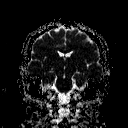
[im 35/35]
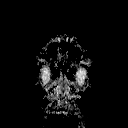

[Series 9: T2 · axial · 5.0mm · 0.51mm/px · z∈[-54,+101]mm · 2 of 24 slices shown (1 of 2)]
[im 1/24]
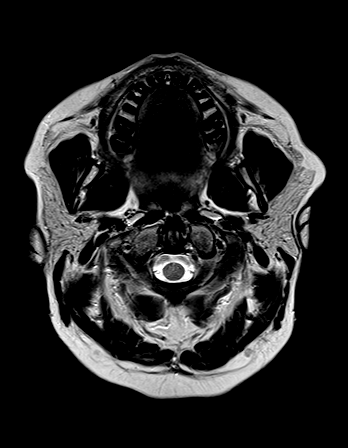
[im 24/24]
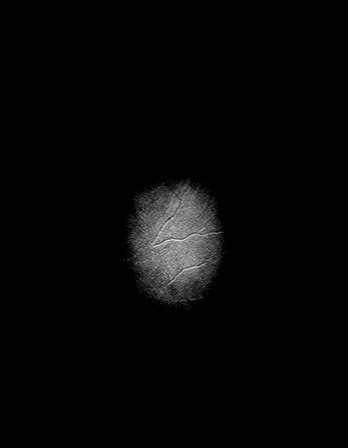

[Series 10: FLAIR · axial · 3.0mm · 0.45mm/px · z∈[-53,+102]mm · 2 of 27 slices shown]
[im 1/27]
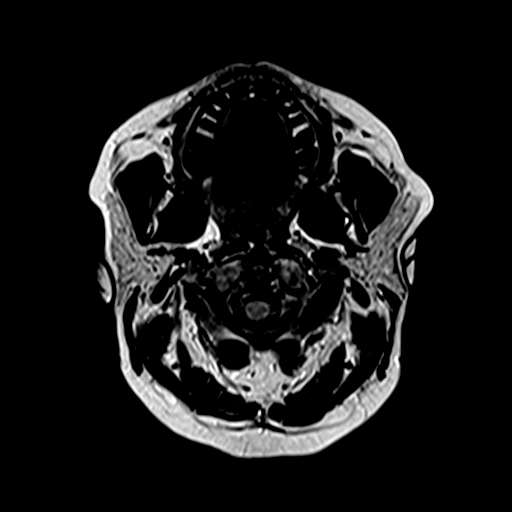
[im 27/27]
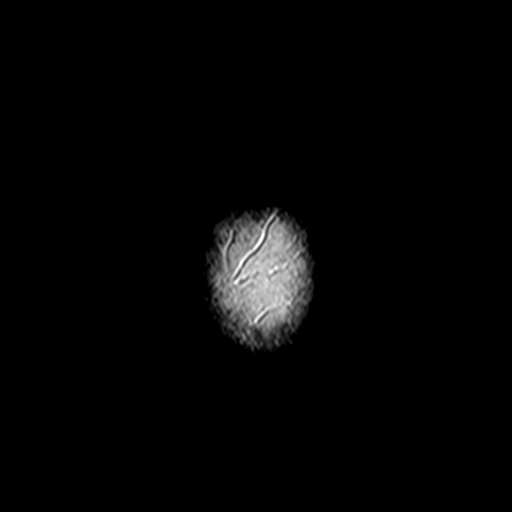

[Series 11: t1_mpr_tra copy center · axial · 1.0mm · 0.45mm/px · z∈[-56,+102]mm · 10 of 160 slices shown]
[im 1/160]
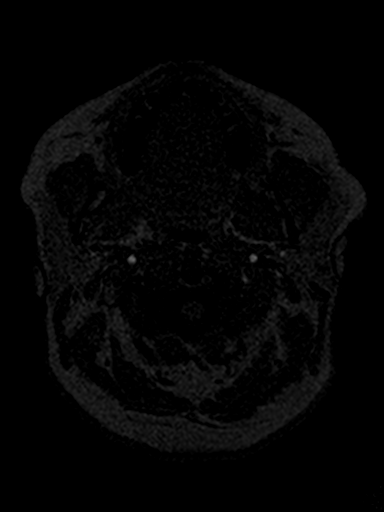
[im 14/160]
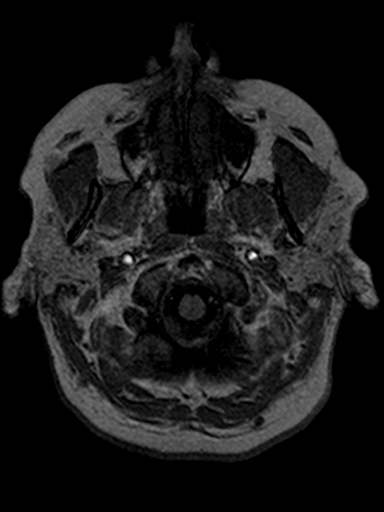
[im 27/160]
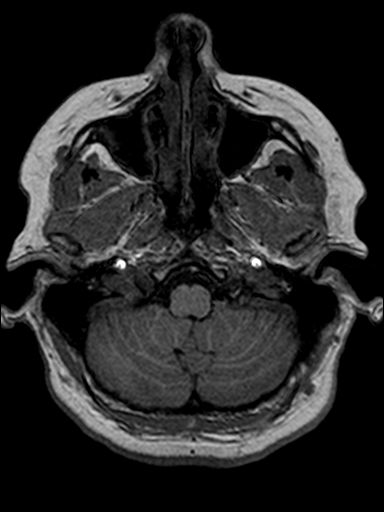
[im 40/160]
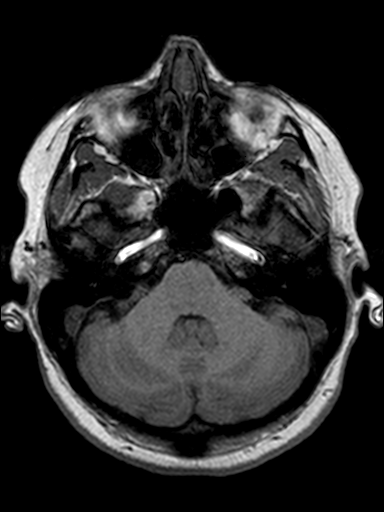
[im 54/160]
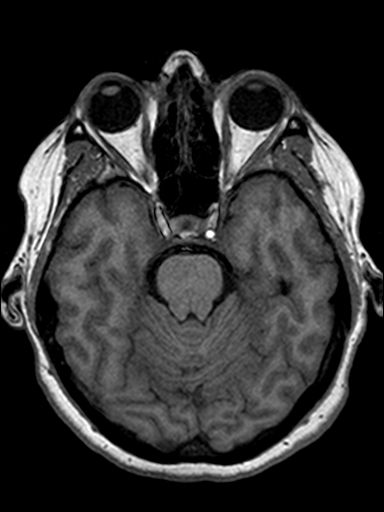
[im 67/160]
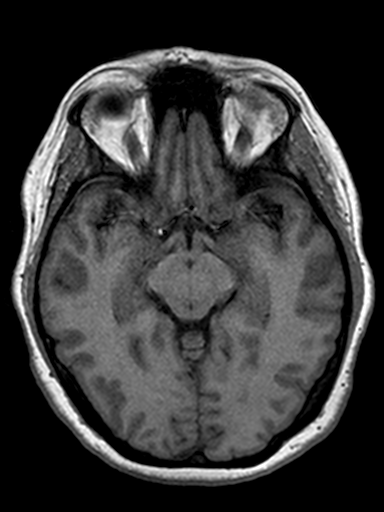
[im 93/160]
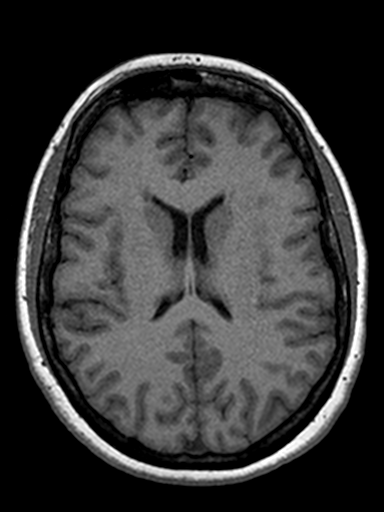
[im 107/160]
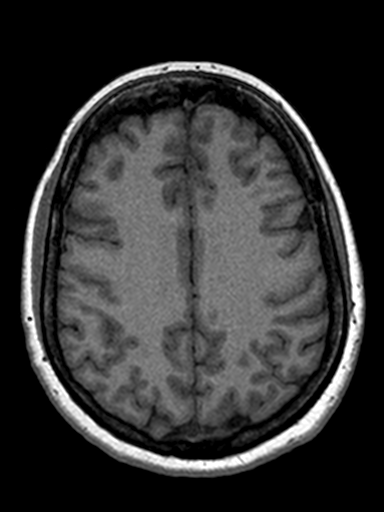
[im 133/160]
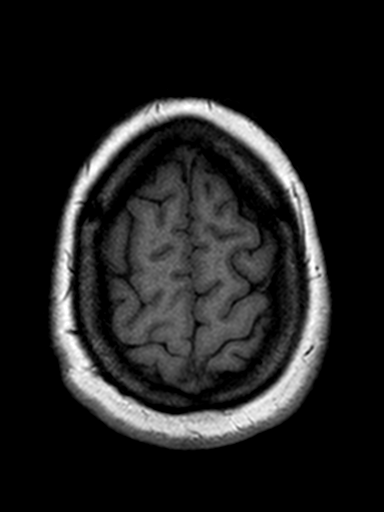
[im 160/160]
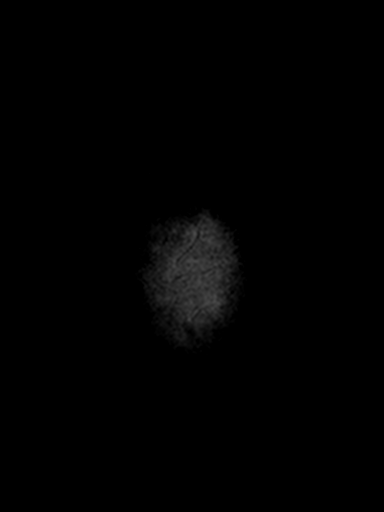

[Series 12: T2 · coronal · 5.0mm · 0.45mm/px · 2 of 31 slices shown (2 of 2)]
[im 1/31]
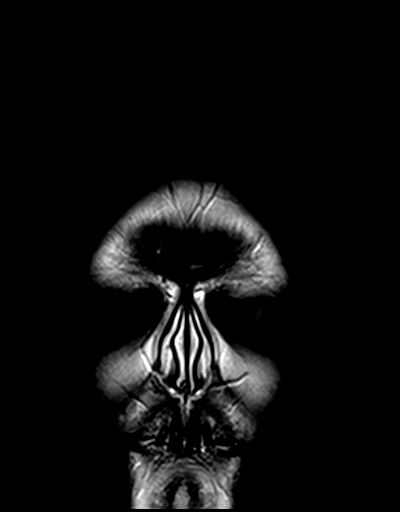
[im 31/31]
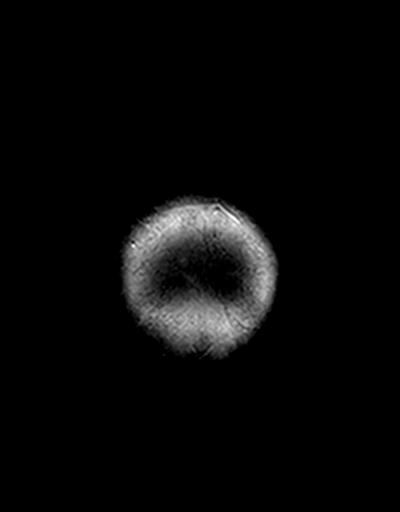

[45 of 48 positions shown; findings below may reference images not displayed]

FINDINGS: BRAIN: There is no acute infarct, acute hemorrhage, hydrocephalus or
extra-axial collection. The midline structures are normal. No
midline shift or other mass effect. There are no old infarcts. The
white matter signal is normal for the patient's age. The cerebral
and cerebellar volume are age-appropriate. Susceptibility-sensitive
sequences show no chronic microhemorrhage or superficial siderosis.
Unchanged appearance of dense extra-axial calcification over the
left frontal convexity.

VASCULAR: Major intracranial arterial and venous sinus flow voids
are normal.

SKULL AND UPPER CERVICAL SPINE: Calvarial bone marrow signal is
normal. There is no skull base mass. Visualized upper cervical spine
and soft tissues are normal.

SINUSES/ORBITS: No fluid levels or advanced mucosal thickening. No
mastoid or middle ear effusion. The orbits are normal.
IMPRESSION: Normal brain.

## 2020-04-01 ENCOUNTER — Other Ambulatory Visit: Payer: Self-pay

## 2020-04-01 ENCOUNTER — Ambulatory Visit (INDEPENDENT_AMBULATORY_CARE_PROVIDER_SITE_OTHER): Payer: Commercial Managed Care - PPO | Admitting: Obstetrics & Gynecology

## 2020-04-01 ENCOUNTER — Encounter: Payer: Self-pay | Admitting: Obstetrics & Gynecology

## 2020-04-01 VITALS — BP 122/80 | Ht 64.5 in | Wt 214.0 lb

## 2020-04-01 DIAGNOSIS — Z01419 Encounter for gynecological examination (general) (routine) without abnormal findings: Secondary | ICD-10-CM | POA: Diagnosis not present

## 2020-04-01 DIAGNOSIS — N920 Excessive and frequent menstruation with regular cycle: Secondary | ICD-10-CM | POA: Diagnosis not present

## 2020-04-01 DIAGNOSIS — Z9189 Other specified personal risk factors, not elsewhere classified: Secondary | ICD-10-CM

## 2020-04-01 NOTE — Progress Notes (Signed)
Valerie Bernard 06-Feb-1976 [id number]   History:    44 y.o.  G3P3L3  Married.  Vasectomy  RP: Established patient presenting for Annual/Gyn exam  HPI: Menorrhagia, [place] Myosure Excision of Polyp/D+C 02/20/2019.  Started on the Progestin pill after that.  Had too much BTB, so stopped the pill in 12/2019.  Since then, heavy periods again monthly.  Painful ovulation.  No pelvic pain otherwise.  No pain with IC.  Breasts normal.  Urine/BMs normal. Hypothyroidism on Synthroid.  BMI 36.17.  Health labs with Fam MD.   Past medical history,surgical history, family history and social history were all reviewed and documented in the EPIC chart.  Gynecologic History Patient's last menstrual period was 03/06/2020.  Obstetric History OB History  Gravida Para Term Preterm AB Living  3 3       3   SAB TAB Ectopic Multiple Live Births               # Outcome Date GA Lbr Len/2nd Weight Sex Delivery Anes PTL Lv  3 Para           2 Para           1 Para              ROS: A ROS was performed and pertinent positives and negatives are included in the history.  GENERAL: No fevers or chills. HEENT: No change in vision, no earache, sore throat or sinus congestion. NECK: No pain or stiffness. CARDIOVASCULAR: No chest pain or pressure. No palpitations. PULMONARY: No shortness of breath, cough or wheeze. GASTROINTESTINAL: No abdominal pain, nausea, vomiting or diarrhea, melena or bright red blood per rectum. GENITOURINARY: No urinary frequency, urgency, hesitancy or dysuria. MUSCULOSKELETAL: No joint or muscle pain, no back pain, no recent trauma. DERMATOLOGIC: No rash, no itching, no lesions. ENDOCRINE: No polyuria, polydipsia, no heat or cold intolerance. No recent change in weight. HEMATOLOGICAL: No anemia or easy bruising or bleeding. NEUROLOGIC: No headache, seizures, numbness, tingling or weakness. PSYCHIATRIC: No depression, no loss of interest in normal activity or change in sleep pattern.      Exam:   BP 122/80   Ht 5' 4.5" (1.638 m)   Wt 214 lb (97.1 kg)   LMP 03/06/2020   BMI 36.17 kg/m   Body mass index is 36.17 kg/m.  General appearance : Well developed well nourished female. No acute distress HEENT: Eyes: no retinal hemorrhage or exudates,  Neck supple, trachea midline, no carotid bruits, no thyroidmegaly Lungs: Clear to auscultation, no rhonchi or wheezes, or rib retractions  Heart: Regular rate and rhythm, no murmurs or gallops Breast:Examined in sitting and supine position were symmetrical in appearance, no palpable masses or tenderness,  no skin retraction, no nipple inversion, no nipple discharge, no skin discoloration, no axillary or supraclavicular lymphadenopathy Abdomen: no palpable masses or tenderness, no rebound or guarding Extremities: no edema or skin discoloration or tenderness  Pelvic: Vulva: Normal             Vagina: No gross lesions or discharge  Cervix: No gross lesions or discharge.  Pap reflex done.  Uterus  AV, normal size, shape and consistency, non-tender and mobile  Adnexa  Without masses or tenderness  Anus: Normal   Assessment/Plan:  44 y.o. female for annual exam   1. Encounter for routine gynecological examination with Papanicolaou smear of cervix Normal gynecologic exam.  Pap reflex done.  Breast exam normal.  Will schedule a screening mammogram now.  Health labs  with family physician.  2. Relies on partner vasectomy for contraception  3. Menorrhagia with regular cycle Patient had hysteroscopy with excision of polyps and D&C in July 2020.  Breakthrough bleeding on the progestin only pill, stopped in May 2021.  Recurrence of heavy menses since then.  Counseling done on management of heavy menses.  Considering the progesterone IUD versus endometrial ablation.  Will call back when decision made.  Will check hemoglobin with a CBC at wellness exam with her family physician soon.   Princess Bruins MD, 3:41 PM 04/01/2020

## 2020-04-02 NOTE — Addendum Note (Signed)
Addended by: Thurnell Garbe A on: 04/02/2020 11:32 AM   Modules accepted: Orders

## 2020-04-04 LAB — PAP IG W/ RFLX HPV ASCU

## 2020-06-02 ENCOUNTER — Other Ambulatory Visit: Payer: Self-pay | Admitting: Family Medicine

## 2020-06-02 DIAGNOSIS — Z1231 Encounter for screening mammogram for malignant neoplasm of breast: Secondary | ICD-10-CM

## 2020-06-06 ENCOUNTER — Other Ambulatory Visit: Payer: Self-pay

## 2020-06-06 ENCOUNTER — Ambulatory Visit
Admission: RE | Admit: 2020-06-06 | Discharge: 2020-06-06 | Disposition: A | Discharge status: Home / Self Care | Payer: 59 | Source: Ambulatory Visit | Attending: Family Medicine | Admitting: Family Medicine

## 2020-06-06 DIAGNOSIS — Z1231 Encounter for screening mammogram for malignant neoplasm of breast: Secondary | ICD-10-CM

## 2020-06-11 ENCOUNTER — Other Ambulatory Visit: Payer: Self-pay | Admitting: Family Medicine

## 2020-06-11 DIAGNOSIS — R928 Other abnormal and inconclusive findings on diagnostic imaging of breast: Secondary | ICD-10-CM

## 2020-06-27 ENCOUNTER — Other Ambulatory Visit: Payer: Self-pay | Admitting: Family Medicine

## 2020-06-27 ENCOUNTER — Ambulatory Visit
Admission: RE | Admit: 2020-06-27 | Discharge: 2020-06-27 | Disposition: A | Discharge status: Home / Self Care | Payer: 59 | Source: Ambulatory Visit | Attending: Family Medicine | Admitting: Family Medicine

## 2020-06-27 ENCOUNTER — Other Ambulatory Visit: Payer: Self-pay

## 2020-06-27 DIAGNOSIS — R928 Other abnormal and inconclusive findings on diagnostic imaging of breast: Secondary | ICD-10-CM

## 2020-06-27 DIAGNOSIS — N6489 Other specified disorders of breast: Secondary | ICD-10-CM

## 2020-07-07 ENCOUNTER — Ambulatory Visit
Admission: RE | Admit: 2020-07-07 | Discharge: 2020-07-07 | Disposition: A | Discharge status: Home / Self Care | Payer: 59 | Source: Ambulatory Visit | Attending: Family Medicine | Admitting: Family Medicine

## 2020-07-07 ENCOUNTER — Other Ambulatory Visit: Payer: Self-pay

## 2020-07-07 DIAGNOSIS — N6489 Other specified disorders of breast: Secondary | ICD-10-CM

## 2020-07-09 HISTORY — PX: BREAST BIOPSY: SHX20

## 2020-07-18 ENCOUNTER — Encounter: Payer: Self-pay | Admitting: Gastroenterology

## 2020-09-12 ENCOUNTER — Ambulatory Visit (AMBULATORY_SURGERY_CENTER): Payer: Self-pay | Admitting: *Deleted

## 2020-09-12 ENCOUNTER — Other Ambulatory Visit: Payer: Self-pay

## 2020-09-12 VITALS — Ht 64.5 in | Wt 226.0 lb

## 2020-09-12 DIAGNOSIS — Z8371 Family history of colonic polyps: Secondary | ICD-10-CM

## 2020-09-12 DIAGNOSIS — Z1211 Encounter for screening for malignant neoplasm of colon: Secondary | ICD-10-CM

## 2020-09-12 MED ORDER — PLENVU 140 G PO SOLR: 1.0000 | Freq: Once | ORAL | 0 refills | Status: AC

## 2020-09-12 NOTE — Progress Notes (Signed)
Patient is here in-person for PV. Patient denies any allergies to eggs or soy. Patient denies any problems with anesthesia/sedation. Patient denies any oxygen use at home. Patient denies taking any diet/weight loss medications or blood thinners. Patient is not being treated for MRSA or C-diff. Patient is aware of our care-partner policy and VXYIA-16 safety protocol. EMMI education assigned to the patient for the procedure, sent to [facility].   COVID-19 vaccines completed on 11/03/19 x2, per patient.   Prep Prescription coupon given to the patient.

## 2020-09-24 ENCOUNTER — Encounter: Payer: Self-pay | Admitting: Gastroenterology

## 2020-09-26 ENCOUNTER — Encounter: Payer: Self-pay | Admitting: Gastroenterology

## 2020-09-26 ENCOUNTER — Ambulatory Visit (AMBULATORY_SURGERY_CENTER): Payer: 59 | Admitting: Gastroenterology

## 2020-09-26 ENCOUNTER — Other Ambulatory Visit: Payer: Self-pay

## 2020-09-26 VITALS — BP 126/79 | HR 64 | Temp 97.3°F | Resp 21 | Ht 64.5 in | Wt 226.0 lb

## 2020-09-26 DIAGNOSIS — Z8371 Family history of colonic polyps: Secondary | ICD-10-CM | POA: Diagnosis not present

## 2020-09-26 DIAGNOSIS — D123 Benign neoplasm of transverse colon: Secondary | ICD-10-CM

## 2020-09-26 DIAGNOSIS — D128 Benign neoplasm of rectum: Secondary | ICD-10-CM

## 2020-09-26 DIAGNOSIS — Z1211 Encounter for screening for malignant neoplasm of colon: Secondary | ICD-10-CM

## 2020-09-26 DIAGNOSIS — Z83719 Family history of colon polyps, unspecified: Secondary | ICD-10-CM

## 2020-09-26 MED ORDER — SODIUM CHLORIDE 0.9 % IV SOLN
500.0000 mL | Freq: Once | INTRAVENOUS | Status: DC
Start: 2020-09-26 — End: 2020-09-26

## 2020-09-26 NOTE — Progress Notes (Signed)
VS-CW  Pt's states no medical or surgical changes since previsit or office visit.  

## 2020-09-26 NOTE — Progress Notes (Signed)
A/ox3, pleased with MAC, report to RN 

## 2020-09-26 NOTE — Op Note (Signed)
[facility] Patient Name: Valerie Bernard Procedure Date: 09/26/2020 7:47 AM MRN: [id number] Endoscopist: Mauri Pole , MD Age: 45 Referring MD:  Date of Birth: [birth year] Gender: Female Account #: [id number] Procedure:                Colonoscopy Indications:              Screening for colorectal malignant neoplasm Medicines:                Monitored Anesthesia Care Procedure:                Pre-Anesthesia Assessment:                           - Prior to the procedure, a History and Physical                            was performed, and patient medications and                            allergies were reviewed. The patient's tolerance of                            previous anesthesia was also reviewed. The risks                            and benefits of the procedure and the sedation                            options and risks were discussed with the patient.                            All questions were answered, and informed consent                            was obtained. Prior Anticoagulants: The patient has                            taken no previous anticoagulant or antiplatelet                            agents. ASA Grade Assessment: II - A patient with                            mild systemic disease. After reviewing the risks                            and benefits, the patient was deemed in                            satisfactory condition to undergo the procedure.                           After obtaining informed consent, the colonoscope  was passed under direct vision. Throughout the                            procedure, the patient's blood pressure, pulse, and                            oxygen saturations were monitored continuously. The                            Olympus PCF-H190DL (#[id number]) Colonoscope was                            introduced through the anus and advanced to the the                            cecum,  identified by appendiceal orifice and                            ileocecal valve. The colonoscopy was performed                            without difficulty. The patient tolerated the                            procedure well. The quality of the bowel                            preparation was excellent. The ileocecal valve,                            appendiceal orifice, and rectum were photographed. Scope In: 8:19:29 AM Scope Out: 8:33:15 AM Scope Withdrawal Time: 0 hours 9 minutes 16 seconds  Total Procedure Duration: 0 hours 13 minutes 46 seconds  Findings:                 The perianal and digital rectal examinations were                            normal.                           A 11 mm polyp was found in the transverse colon.                            The polyp was sessile. The polyp was removed with a                            cold snare. Resection and retrieval were complete.                           Two sessile polyps were found in the rectum. The                            polyps were 1 to 2 mm in size. These  polyps were                            removed with a cold biopsy forceps. Resection and                            retrieval were complete.                           Non-bleeding internal hemorrhoids were found during                            retroflexion. The hemorrhoids were medium-sized.                           The exam was otherwise without abnormality. Complications:            No immediate complications. Estimated Blood Loss:     Estimated blood loss was minimal. Impression:               - One 11 mm polyp in the transverse colon, removed                            with a cold snare. Resected and retrieved.                           - Two 1 to 2 mm polyps in the rectum, removed with                            a cold biopsy forceps. Resected and retrieved.                           - Non-bleeding internal hemorrhoids.                           - The  examination was otherwise normal. Recommendation:           - Patient has a contact number available for                            emergencies. The signs and symptoms of potential                            delayed complications were discussed with the                            patient. Return to normal activities tomorrow.                            Written discharge instructions were provided to the                            patient.                           - Resume previous diet.                           -  Continue present medications.                           - Await pathology results.                           - Repeat colonoscopy in 3 - 5 years for                            surveillance based on pathology results. Mauri Pole, MD 09/26/2020 8:43:41 AM This report has been signed electronically.

## 2020-09-26 NOTE — Progress Notes (Signed)
Called to room to assist during endoscopic procedure.  Patient ID and intended procedure confirmed with present staff. Received instructions for my participation in the procedure from the performing physician.  

## 2020-09-26 NOTE — Patient Instructions (Signed)
HANDOUTS PROVIDED ON: POLYPS & HEMORRHOIDS  The polyps removed today have been sent for pathology.  The results can take 1-3 weeks to receive.  When your next colonoscopy should occur will be based on the pathology results.    You may resume your previous diet and medication schedule.  Thank you for allowing [place] to care for you today!!!   YOU HAD AN ENDOSCOPIC PROCEDURE TODAY AT [facility]:   Refer to the procedure report that was given to you for any specific questions about what was found during the examination.  If the procedure report does not answer your questions, please call your gastroenterologist to clarify.  If you requested that your care partner not be given the details of your procedure findings, then the procedure report has been included in a sealed envelope for you to review at your convenience later.  YOU SHOULD EXPECT: Some feelings of bloating in the abdomen. Passage of more gas than usual.  Walking can help get rid of the air that was put into your GI tract during the procedure and reduce the bloating. If you had a lower endoscopy (such as a colonoscopy or flexible sigmoidoscopy) you may notice spotting of blood in your stool or on the toilet paper. If you underwent a bowel prep for your procedure, you may not have a normal bowel movement for a few days.  Please Note:  You might notice some irritation and congestion in your nose or some drainage.  This is from the oxygen used during your procedure.  There is no need for concern and it should clear up in a day or so.  SYMPTOMS TO REPORT IMMEDIATELY:   Following lower endoscopy (colonoscopy or flexible sigmoidoscopy):  Excessive amounts of blood in the stool  Significant tenderness or worsening of abdominal pains  Swelling of the abdomen that is new, acute  Fever of 100F or higher  For urgent or emergent issues, a gastroenterologist can be reached at any hour by calling [phone]. Do not use MyChart  messaging for urgent concerns.    DIET:  We do recommend a small meal at first, but then you may proceed to your regular diet.  Drink plenty of fluids but you should avoid alcoholic beverages for 24 hours.  ACTIVITY:  You should plan to take it easy for the rest of today and you should NOT DRIVE or use heavy machinery until tomorrow (because of the sedation medicines used during the test).    FOLLOW UP: Our staff will call the number listed on your records Tuesday morning between 7:15 am and 8:15 am to check on you and address any questions or concerns that you may have regarding the information given to you following your procedure. If we do not reach you, we will leave a message.  We will attempt to reach you two times.  During this call, we will ask if you have developed any symptoms of COVID 19. If you develop any symptoms (ie: fever, flu-like symptoms, shortness of breath, cough etc.) before then, please call [phone].  If you test positive for Covid 19 in the 2 weeks post procedure, please call and report this information to [place].    If any biopsies were taken you will be contacted by phone or by letter within the next 1-3 weeks.  Please call us at [phone] if you have not heard about the biopsies in 3 weeks.    SIGNATURES/CONFIDENTIALITY: You and/or your care partner have signed paperwork  which will be entered into your electronic medical record.  These signatures attest to the fact that that the information above on your After Visit Summary has been reviewed and is understood.  Full responsibility of the confidentiality of this discharge information lies with you and/or your care-partner.

## 2020-09-30 ENCOUNTER — Telehealth: Payer: Self-pay | Admitting: *Deleted

## 2020-09-30 ENCOUNTER — Telehealth: Payer: Self-pay

## 2020-09-30 NOTE — Telephone Encounter (Signed)
  Follow up Call-  Call back number 09/26/2020  Post procedure Call Back phone  # [phone]  Permission to leave phone message Yes  Some recent data might be hidden     Patient questions:  Message left to call us if necessary.

## 2020-09-30 NOTE — Telephone Encounter (Signed)
Called # [phone] left a message we tried to reach pt for a follow up call. maw

## 2020-10-11 ENCOUNTER — Encounter: Payer: Self-pay | Admitting: Gastroenterology

## 2021-02-19 DIAGNOSIS — E669 Obesity, unspecified: Secondary | ICD-10-CM | POA: Insufficient documentation

## 2021-04-24 DIAGNOSIS — G4733 Obstructive sleep apnea (adult) (pediatric): Secondary | ICD-10-CM | POA: Diagnosis not present

## 2021-05-25 ENCOUNTER — Other Ambulatory Visit: Payer: Self-pay | Admitting: Family Medicine

## 2021-05-25 DIAGNOSIS — Z1231 Encounter for screening mammogram for malignant neoplasm of breast: Secondary | ICD-10-CM

## 2021-05-28 ENCOUNTER — Other Ambulatory Visit: Payer: Self-pay

## 2021-05-28 ENCOUNTER — Encounter: Payer: Self-pay | Admitting: Obstetrics & Gynecology

## 2021-05-28 ENCOUNTER — Ambulatory Visit (INDEPENDENT_AMBULATORY_CARE_PROVIDER_SITE_OTHER): Payer: BC Managed Care – PPO | Admitting: Obstetrics & Gynecology

## 2021-05-28 VITALS — BP 106/80 | HR 65 | Resp 16 | Ht 64.5 in | Wt 197.0 lb

## 2021-05-28 DIAGNOSIS — Z01419 Encounter for gynecological examination (general) (routine) without abnormal findings: Secondary | ICD-10-CM | POA: Diagnosis not present

## 2021-05-28 DIAGNOSIS — Z9189 Other specified personal risk factors, not elsewhere classified: Secondary | ICD-10-CM | POA: Diagnosis not present

## 2021-05-28 DIAGNOSIS — N920 Excessive and frequent menstruation with regular cycle: Secondary | ICD-10-CM

## 2021-05-28 NOTE — Progress Notes (Signed)
Valerie Bernard [birth year] [id number]   History:    45 y.o. G3P3L3  Married.  Vasectomy   RP: Established patient presenting for Annual/Gyn exam   HPI: Menorrhagia, [place] Myosure Excision of Polyp/D+C 02/20/2019.  Started on the Progestin pill after that.  Had too much BTB, so stopped the pill in 12/2019.  Since then, heavy periods again monthly.  Would like to try a Progesterone IUD.  Painful ovulation.  No pelvic pain otherwise.  No pain with IC.  Breasts normal.  Urine/BMs normal. Hypothyroidism on Synthroid.  BMI improved to 33.29.  Health labs with Fam MD.   Past medical history,surgical history, family history and social history were all reviewed and documented in the EPIC chart.  Gynecologic History Patient's last menstrual period was 05/18/2021 (exact date).  Obstetric History OB History  Gravida Para Term Preterm AB Living  3 3       3   SAB IAB Ectopic Multiple Live Births               # Outcome Date GA Lbr Len/2nd Weight Sex Delivery Anes PTL Lv  3 Para           2 Para           1 Para              ROS: A ROS was performed and pertinent positives and negatives are included in the history.  GENERAL: No fevers or chills. HEENT: No change in vision, no earache, sore throat or sinus congestion. NECK: No pain or stiffness. CARDIOVASCULAR: No chest pain or pressure. No palpitations. PULMONARY: No shortness of breath, cough or wheeze. GASTROINTESTINAL: No abdominal pain, nausea, vomiting or diarrhea, melena or bright red blood per rectum. GENITOURINARY: No urinary frequency, urgency, hesitancy or dysuria. MUSCULOSKELETAL: No joint or muscle pain, no back pain, no recent trauma. DERMATOLOGIC: No rash, no itching, no lesions. ENDOCRINE: No polyuria, polydipsia, no heat or cold intolerance. No recent change in weight. HEMATOLOGICAL: No anemia or easy bruising or bleeding. NEUROLOGIC: No headache, seizures, numbness, tingling or weakness. PSYCHIATRIC: No depression, no loss of interest  in normal activity or change in sleep pattern.     Exam:   BP 106/80   Pulse 65   Resp 16   Ht 5' 4.5" (1.638 m)   Wt 197 lb (89.4 kg)   LMP 05/18/2021 (Exact Date)   BMI 33.29 kg/m   Body mass index is 33.29 kg/m.  General appearance : Well developed well nourished female. No acute distress HEENT: Eyes: no retinal hemorrhage or exudates,  Neck supple, trachea midline, no carotid bruits, no thyroidmegaly Lungs: Clear to auscultation, no rhonchi or wheezes, or rib retractions  Heart: Regular rate and rhythm, no murmurs or gallops Breast:Examined in sitting and supine position were symmetrical in appearance, no palpable masses or tenderness,  no skin retraction, no nipple inversion, no nipple discharge, no skin discoloration, no axillary or supraclavicular lymphadenopathy Abdomen: no palpable masses or tenderness, no rebound or guarding Extremities: no edema or skin discoloration or tenderness  Pelvic: Vulva: Normal             Vagina: No gross lesions or discharge  Cervix: No gross lesions or discharge  Uterus  AV, normal size, shape and consistency, non-tender and mobile  Adnexa  Without masses or tenderness  Anus: Normal   Assessment/Plan:  45 y.o. female for annual exam   1. Well female exam with routine gynecological exam Normal gynecologic exam.  No  indication for Pap test at this time.  Last Pap test in 03/2020 was negative.  Breast exam normal.  Screening mammogram scheduled.  Colonoscopy February 2022.  Health labs with family physician.  Body mass index 33.29.  Recommend a lower calorie/carb diet.  Aerobic activities 5 times a week and light weightlifting every 2 days.  2. Relies on partner vasectomy for contraception  3. Menorrhagia with regular cycle Decision to proceed with insertion of Mirena IUD to control menorrhagia.  IUD insertion procedure, risks and benefits of IUD thoroughly reviewed with [name].  [name] IUD pamphlet given.  Follow-up [name] IUD  insertion. - IUD Insertion; Future   Princess Bruins MD, 2:16 PM 05/28/2021

## 2021-06-10 ENCOUNTER — Ambulatory Visit: Payer: BC Managed Care – PPO | Admitting: Obstetrics & Gynecology

## 2021-06-19 ENCOUNTER — Ambulatory Visit
Admission: RE | Admit: 2021-06-19 | Discharge: 2021-06-19 | Disposition: A | Discharge status: Home / Self Care | Payer: BC Managed Care – PPO | Source: Ambulatory Visit | Attending: Family Medicine | Admitting: Family Medicine

## 2021-06-19 ENCOUNTER — Other Ambulatory Visit: Payer: Self-pay

## 2021-06-19 DIAGNOSIS — Z1231 Encounter for screening mammogram for malignant neoplasm of breast: Secondary | ICD-10-CM | POA: Diagnosis not present

## 2021-07-03 DIAGNOSIS — E559 Vitamin D deficiency, unspecified: Secondary | ICD-10-CM | POA: Diagnosis not present

## 2021-07-03 DIAGNOSIS — E785 Hyperlipidemia, unspecified: Secondary | ICD-10-CM | POA: Diagnosis not present

## 2021-07-03 DIAGNOSIS — F39 Unspecified mood [affective] disorder: Secondary | ICD-10-CM | POA: Diagnosis not present

## 2021-07-03 DIAGNOSIS — E039 Hypothyroidism, unspecified: Secondary | ICD-10-CM | POA: Diagnosis not present

## 2021-08-26 ENCOUNTER — Ambulatory Visit (INDEPENDENT_AMBULATORY_CARE_PROVIDER_SITE_OTHER): Payer: BC Managed Care – PPO | Admitting: Obstetrics & Gynecology

## 2021-08-26 ENCOUNTER — Other Ambulatory Visit: Payer: Self-pay

## 2021-08-26 ENCOUNTER — Encounter: Payer: Self-pay | Admitting: Obstetrics & Gynecology

## 2021-08-26 VITALS — BP 124/80

## 2021-08-26 DIAGNOSIS — Z9189 Other specified personal risk factors, not elsewhere classified: Secondary | ICD-10-CM

## 2021-08-26 DIAGNOSIS — Z01812 Encounter for preprocedural laboratory examination: Secondary | ICD-10-CM

## 2021-08-26 DIAGNOSIS — Z30430 Encounter for insertion of intrauterine contraceptive device: Secondary | ICD-10-CM

## 2021-08-26 DIAGNOSIS — N920 Excessive and frequent menstruation with regular cycle: Secondary | ICD-10-CM

## 2021-08-26 LAB — PREGNANCY, URINE: Preg Test, Ur: NEGATIVE

## 2021-08-26 NOTE — Progress Notes (Signed)
Valerie Bernard February 08, 1976 [id number]        46 y.o.  G3P3L3   RP: Menorrhagia for Mirena IUD insertion  HPI: Menorrhagia not controled with the Progestin pill.  Decision to manage with a Mirena IUD.  On Annual Gyn exam 05/2021 we noted:  Menorrhagia, [place] Myosure Excision of Polyp/D+C 02/20/2019.  Started on the Progestin pill after that.  Had too much BTB, so stopped the pill in 12/2019.  Since then, heavy periods again monthly.  Would like to try a Progesterone IUD.  Painful ovulation.  No pelvic pain otherwise.  No pain with IC.   OB History  Gravida Para Term Preterm AB Living  3 3       3   SAB IAB Ectopic Multiple Live Births               # Outcome Date GA Lbr Len/2nd Weight Sex Delivery Anes PTL Lv  3 Para           2 Para           1 Para             Past medical history,surgical history, problem list, medications, allergies, family history and social history were all reviewed and documented in the EPIC chart.   Directed ROS with pertinent positives and negatives documented in the history of present illness/assessment and plan.  Exam:  Vitals:   08/26/21 1517  BP: 124/80   General appearance:  Normal                                                                    IUD procedure note       Patient presented to the office today for placement of Mirena IUD. The patient had previously been provided with literature information on this method of contraception. The risks benefits and pros and cons were discussed and all her questions were answered. She is fully aware that this form of contraception is 99% effective and is good for 7 years.  Pelvic exam: Vulva normal Vagina: No lesions or discharge Cervix: No lesions or discharge Uterus: AV position, about 10 cm  Adnexa: No masses or tenderness Rectal exam: Not done  The cervix was cleansed with Betadine solution. Hurricane spray on the cervix. The Os Finder was used confirming an AV uterus with a hysterometry at 9  cm.  The IUD was shown to the patient and inserted in a sterile fashion.  The IUD string was trimmed. Patient was instructed to return back to the office in one month for follow up.        UPT Neg   Assessment/Plan:  46 y.o. G3P3L3   1. Menorrhagia with regular cycle Menorrhagia not controled with the Progestin pill.  Decision to manage with a Mirena IUD.  On Annual Gyn exam 05/2021 we noted:  Menorrhagia, [place] Myosure Excision of Polyp/D+C 02/20/2019. Started on the Progestin pill after that.  Had too much BTB, so stopped the pill in 12/2019.  Since then, heavy periods again monthly.  Would like to try a Progesterone IUD.  Painful ovulation.  No pelvic pain otherwise.  No pain with IC. Easy insertion of Mirena IUD without complication.  Well tolerated by patient.  Post procedure precautions  reviewed.  F/U 4 weeks for IUD check. - IUD Insertion  2. Encounter for IUD insertion Easy insertion of Mirena IUD without complication.  Well tolerated by patient.  Post procedure precautions reviewed.  F/U 4 weeks for IUD check. - IUD Insertion  3. Relies on partner vasectomy for contraception  4. Pre-procedure lab exam UPT Neg - Pregnancy, urine   Princess Bruins MD, 3:44 PM 08/26/2021

## 2021-10-05 ENCOUNTER — Ambulatory Visit: Payer: BC Managed Care – PPO | Admitting: Obstetrics & Gynecology

## 2021-10-05 ENCOUNTER — Other Ambulatory Visit: Payer: Self-pay

## 2021-10-05 ENCOUNTER — Encounter: Payer: Self-pay | Admitting: Obstetrics & Gynecology

## 2021-10-05 VITALS — BP 110/72 | HR 63 | Resp 20

## 2021-10-05 DIAGNOSIS — Z30431 Encounter for routine checking of intrauterine contraceptive device: Secondary | ICD-10-CM | POA: Diagnosis not present

## 2021-10-05 DIAGNOSIS — N920 Excessive and frequent menstruation with regular cycle: Secondary | ICD-10-CM

## 2021-10-05 NOTE — Progress Notes (Signed)
° ° °  Valerie Bernard 09/21/1975 [id number]        46 y.o.  G3P3L3  RP: Mirena IUD check post insertion on 08/26/2021  HPI: Had heavy regular periods for which the Mirena IUD was inserted on 08/26/2021.  Menstrual flow improved on LMP since insertion.  BTB is brown and improving.  No pelvic pain.  No fever.   OB History  Gravida Para Term Preterm AB Living  3 3       3   SAB IAB Ectopic Multiple Live Births               # Outcome Date GA Lbr Len/2nd Weight Sex Delivery Anes PTL Lv  3 Para           2 Para           1 Para             Past medical history,surgical history, problem list, medications, allergies, family history and social history were all reviewed and documented in the EPIC chart.   Directed ROS with pertinent positives and negatives documented in the history of present illness/assessment and plan.  Exam:  Vitals:   10/05/21 1636  BP: 110/72  Pulse: 63  Resp: 20  SpO2: 98%   General appearance:  Normal  Abdomen: Normal  Gynecologic exam: Vulva normal.  Speculum:  Cervix normal, no erythema.  IUD strings visible at the EO.  Mild dark blood from the EO.  Vagina normal.   Assessment/Plan:  46 y.o. G3P3   1. Encounter for routine checking of intrauterine contraceptive device (IUD) Mirena IUD well tolerated and in good position.  No sign of infection.  Patient reassured.  2. Menorrhagia with regular cycle  Had heavy regular periods for which the Mirena IUD was inserted on 08/26/2021.  Menstrual flow improved on LMP since insertion.  BTB is brown and improving.  No pelvic pain.  No fever.  Princess Bruins MD, 4:53 PM 10/05/2021

## 2022-02-25 IMAGING — US US BREAST*R* LIMITED INC AXILLA
1 series · 5 of 5 positions shown · non-contrast
Comparison: Previous exam(s).

CLINICAL DATA: Recall from screening mammography with
tomosynthesis, possible developing asymmetry involving the
retroareolar RIGHT breast at POSTERIOR depth.

EXAM:
DIGITAL DIAGNOSTIC RIGHT MAMMOGRAM WITH CAD AND TOMO
ULTRASOUND RIGHT BREAST

[Series 1: us breast*right* limited inc axilla · 0.13mm/px · 5 of 5 slices shown]
[im 1/5]
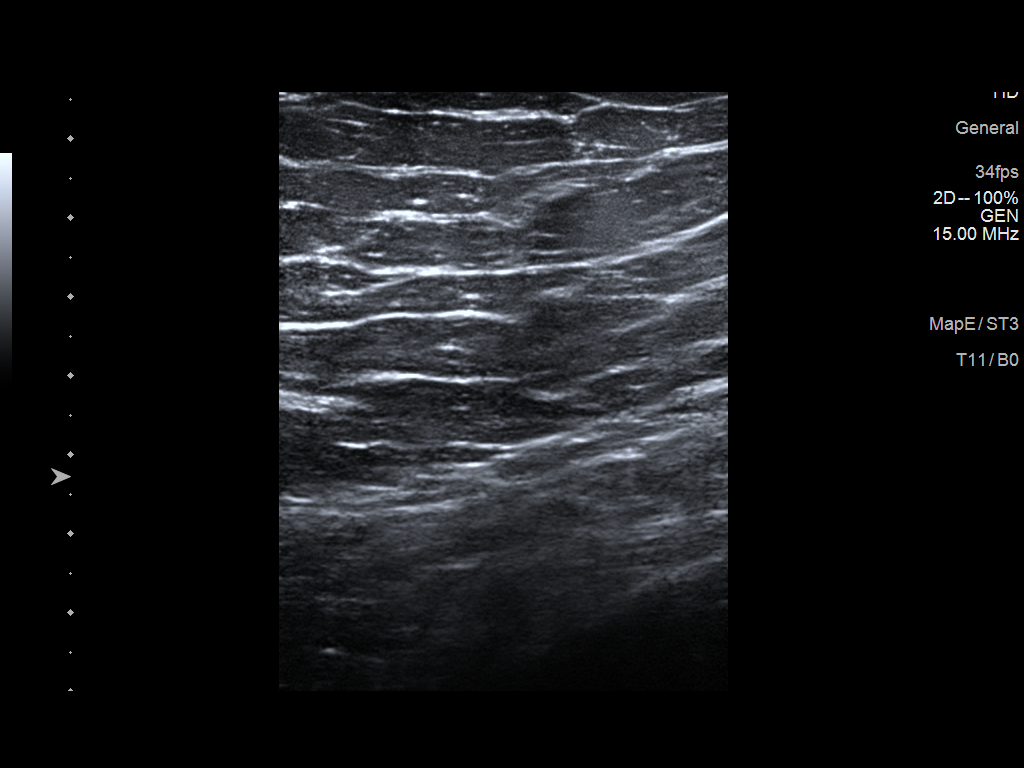
[im 2/5]
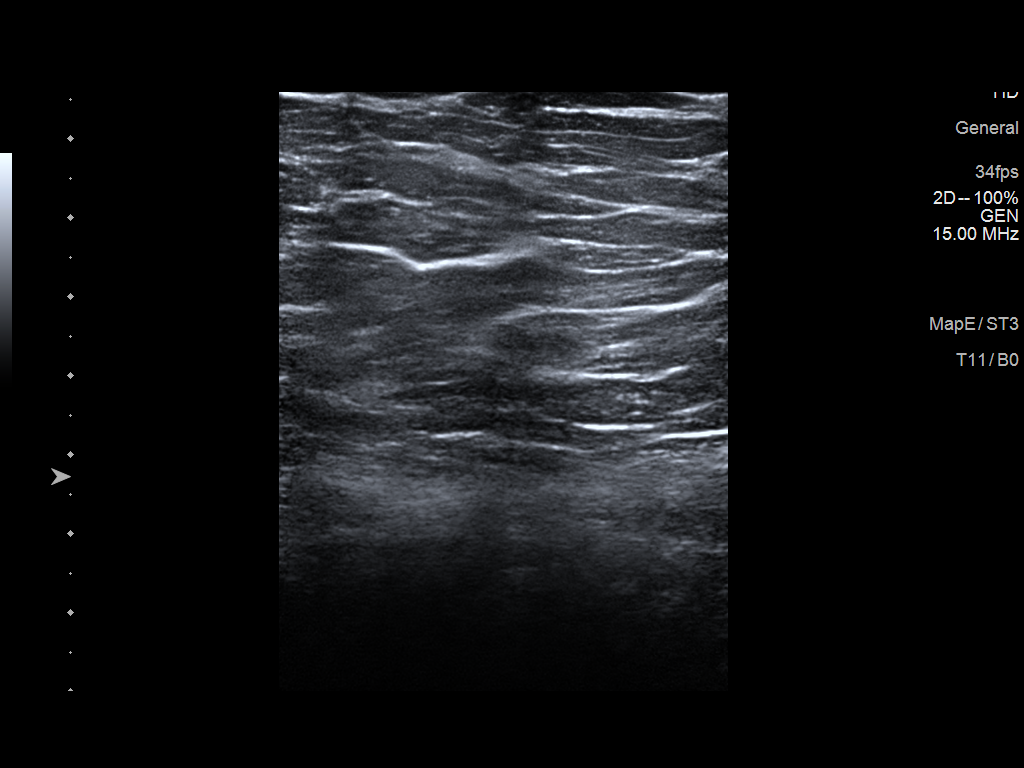
[im 3/5]
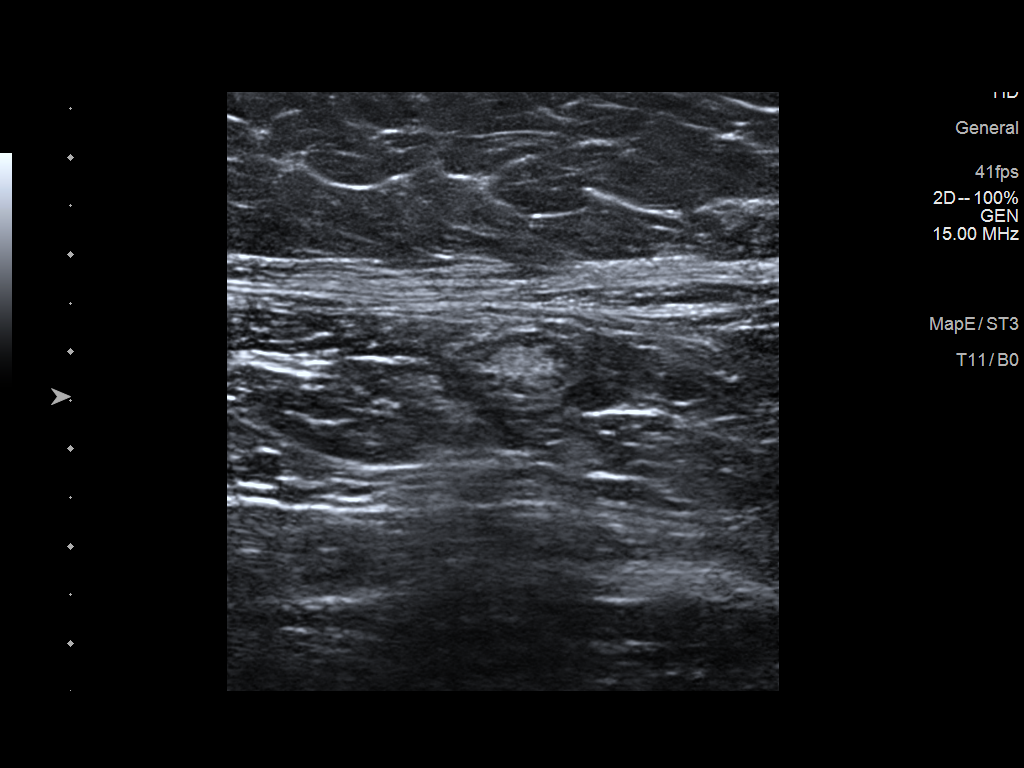
[im 4/5]
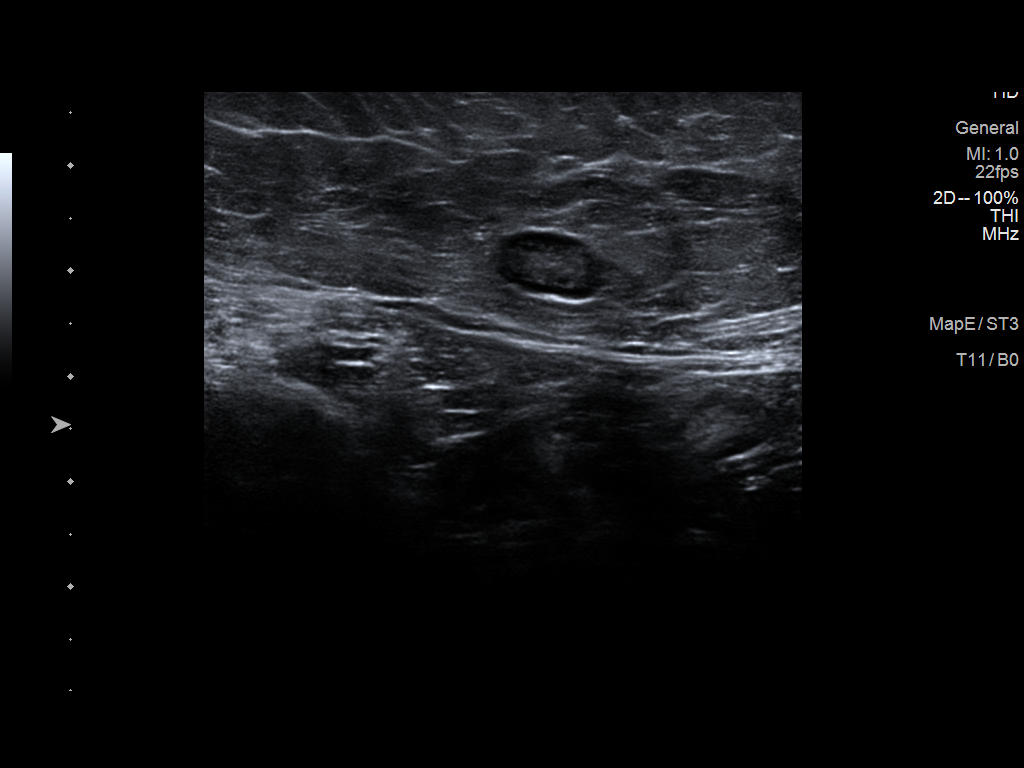
[im 5/5]
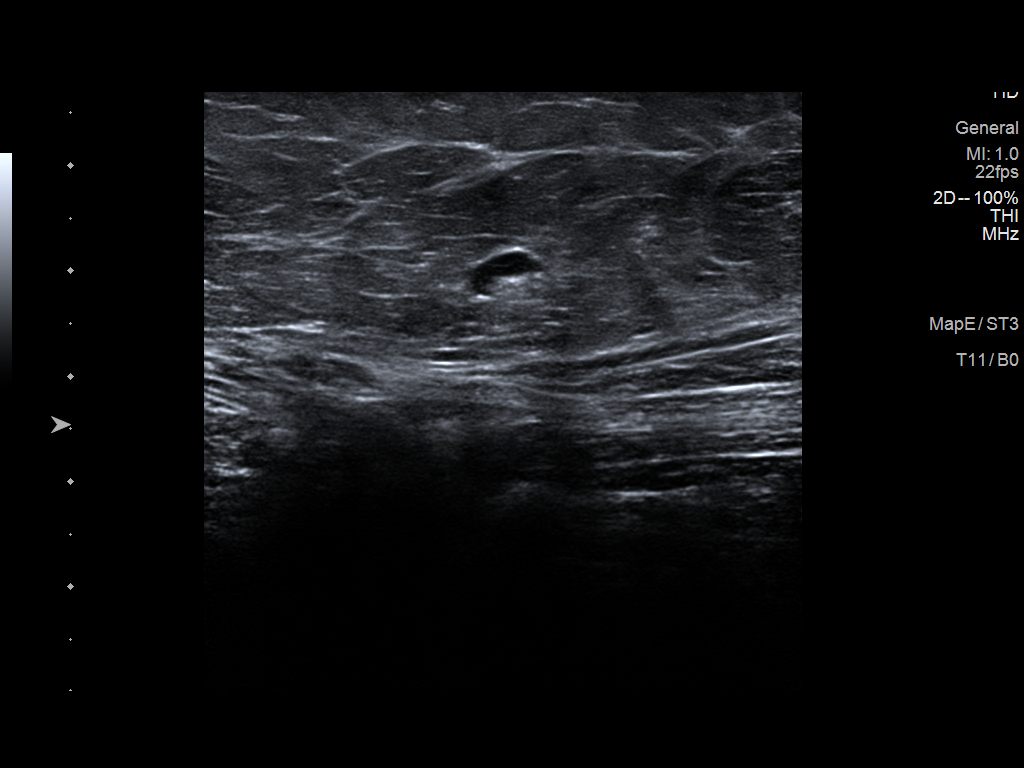

[5 of 5 positions shown; findings below may reference images not displayed]

ACR Breast Density Category b: There are scattered areas of
fibroglandular density.
FINDINGS: Tomosynthesis and synthesized digital 2D spot-compression CC and MLO
views of the area of concern in the RIGHT breast and a tomosynthesis
and synthesized digital 2D full field mediolateral view of the RIGHT
breast were obtained. Mammographic images were processed with CAD.

The developing asymmetry in the retroareolar RIGHT breast at
POSTERIOR depth persists on the spot compression images. The
asymmetry is isodense with interspersed fat and measures just over 1
cm in size. There is no associated mass, architectural distortion or
suspicious calcifications. On the full field mediolateral images,
the asymmetry is almost directly behind nipple, perhaps slightly
below the nipple.

Targeted RIGHT breast ultrasound is performed, showing no
sonographic correlate for the developing asymmetry.

Sonographic evaluation of the RIGHT axilla demonstrates no
pathologic lymphadenopathy.
IMPRESSION: 1. Indeterminate developing asymmetry involving the retroareolar
RIGHT breast at POSTERIOR depth without sonographic correlate.
2. No pathologic RIGHT axillary lymphadenopathy.

RECOMMENDATION:
Stereotactic tomosynthesis core needle biopsy of the developing
asymmetry in the RIGHT breast.

The stereotactic tomosynthesis biopsy procedure was discussed with
the patient and her questions were answered. She wishes to proceed
and the biopsy has been scheduled at her convenience.

I have discussed the findings and recommendations with the patient.

BI-RADS CATEGORY  4: Suspicious.

## 2022-08-11 ENCOUNTER — Other Ambulatory Visit: Payer: Self-pay | Admitting: Family Medicine

## 2022-08-11 DIAGNOSIS — Z1231 Encounter for screening mammogram for malignant neoplasm of breast: Secondary | ICD-10-CM

## 2022-09-30 ENCOUNTER — Ambulatory Visit
Admission: RE | Admit: 2022-09-30 | Discharge: 2022-09-30 | Disposition: A | Discharge status: Home / Self Care | Payer: BC Managed Care – PPO | Source: Ambulatory Visit | Attending: Family Medicine | Admitting: Family Medicine

## 2022-09-30 DIAGNOSIS — Z1231 Encounter for screening mammogram for malignant neoplasm of breast: Secondary | ICD-10-CM

## 2023-09-05 ENCOUNTER — Ambulatory Visit: Payer: 59 | Admitting: Family Medicine

## 2023-09-05 ENCOUNTER — Encounter: Payer: Self-pay | Admitting: Gastroenterology

## 2023-09-05 ENCOUNTER — Encounter: Payer: Self-pay | Admitting: Family Medicine

## 2023-09-05 VITALS — BP 104/69 | HR 73 | Temp 98.2°F | Resp 18 | Ht 64.0 in | Wt 181.0 lb

## 2023-09-05 DIAGNOSIS — E039 Hypothyroidism, unspecified: Secondary | ICD-10-CM

## 2023-09-05 DIAGNOSIS — Z79890 Hormone replacement therapy (postmenopausal): Secondary | ICD-10-CM

## 2023-09-05 DIAGNOSIS — N390 Urinary tract infection, site not specified: Secondary | ICD-10-CM | POA: Diagnosis not present

## 2023-09-05 DIAGNOSIS — Z124 Encounter for screening for malignant neoplasm of cervix: Secondary | ICD-10-CM | POA: Diagnosis not present

## 2023-09-05 DIAGNOSIS — L989 Disorder of the skin and subcutaneous tissue, unspecified: Secondary | ICD-10-CM | POA: Diagnosis not present

## 2023-09-05 DIAGNOSIS — Z7689 Persons encountering health services in other specified circumstances: Secondary | ICD-10-CM

## 2023-09-05 NOTE — Progress Notes (Signed)
New Patient Office Visit  Subjective    Patient ID: Valerie Bernard, female    DOB: 1975/11/20  Age: 48 y.o. MRN: [id number]  CC:  Chief Complaint  Patient presents with   Establish Care    Patient is here to establish care with a new PCP, Patient is here with concerns about a spot in the center of her forehead she states she has had it for about 6 months. She also has concerns about frequent UTI    HPI Valerie Bernard presents to establish care. Pt is new to me.  Pt is here with concerns with a bump near her scalp. She reports it has been present for the last 6 months. No change in size. No pain to the area.  She also reports recurrent UTI's. She reports this has been present for the last year. She reports having 4 UTI's in the last year. Last one was in December. She does report complete resolution of her UTI symptoms after treatment.   Pt goes to [facility] MD for Testosterone pellets started in July 2024. She is on her 3rd pellet. She reports she was having night sweats, libido decrease, and anxiety. They started this due to her symptoms. She reports all of her symptoms have resolved since being on the Testosterone pellets.   Pt has hx of hypothyroidism. She is taking Synthroid 175 mcg along with cytomel 5 mcg daily.   Pt is in need of new OB. She has IUD.   Outpatient Encounter Medications as of 09/05/2023  Medication Sig   ALBUTEROL IN Inhale into the lungs as needed.   Cyanocobalamin (VITAMIN B-12 PO) Take by mouth.   ibuprofen (ADVIL,MOTRIN) 200 MG tablet Take 400 mg by mouth every 6 (six) hours as needed for headache or mild pain.   levothyroxine (SYNTHROID) 175 MCG tablet Take 175 mcg by mouth every morning.   liothyronine (CYTOMEL) 5 MCG tablet Take 5 mcg by mouth 2 (two) times daily.   [DISCONTINUED] levothyroxine (SYNTHROID, LEVOTHROID) 200 MCG tablet Take 1 tablet (200 mcg total) by mouth daily before breakfast. (Patient taking differently: Take 200 mcg by mouth daily  before breakfast.)   [DISCONTINUED] VITAMIN D PO Take by mouth. (Patient not taking: Reported on 09/05/2023)   No facility-administered encounter medications on file as of 09/05/2023.    Past Medical History:  Diagnosis Date   Allergy    Anxiety    Asthma    seasonal   Family history of adverse reaction to anesthesia    mother-- ponv   GERD (gastroesophageal reflux disease)    no meds   History of gestational diabetes    Hypothyroidism    followed by pcp   Mood disorder [facility])     Past Surgical History:  Procedure Laterality Date   BREAST BIOPSY Right 07/09/2020   DILATATION & CURETTAGE/HYSTEROSCOPY WITH MYOSURE N/A 02/20/2019   Procedure: DILATATION & CURETTAGE/HYSTEROSCOPY WITH MYOSURE;  Surgeon: Genia Del, MD;  Location: [facility];  Service: Gynecology;  Laterality: N/A;    Family History  Problem Relation Age of Onset   Asthma Mother    Colon polyps Father    Asthma Daughter    Allergies Daughter    Arthritis Maternal Grandmother    Arthritis Maternal Grandfather    Cancer Paternal Grandfather        lung   Colon cancer Paternal Grandfather    Diabetes Brother    Asthma Son    Esophageal cancer Neg Hx  Rectal cancer Neg Hx    Stomach cancer Neg Hx    Breast cancer Neg Hx     Social History   Socioeconomic History   Marital status: Married    Spouse name: Not on file   Number of children: Not on file   Years of education: Not on file   Highest education level: Not on file  Occupational History   Not on file  Tobacco Use   Smoking status: Every Day    Current packs/day: 0.25    Average packs/day: 0.3 packs/day for 20.0 years (5.0 ttl pk-yrs)    Types: Cigarettes    Passive exposure: Current   Smokeless tobacco: Never  Vaping Use   Vaping status: Never Used  Substance and Sexual Activity   Alcohol use: Yes    Comment: once a week   Drug use: Not Currently   Sexual activity: Yes    Partners: Male    Birth  control/protection: Surgical    Comment: 1st intercourse- 54, partners- 31, married- 20 yrs, husband vasectomy  Other Topics Concern   Not on file  Social History Narrative   Not on file   Social Drivers of Health   Financial Resource Strain: Not on file  Food Insecurity: Not on file  Transportation Needs: Not on file  Physical Activity: Not on file  Stress: Not on file  Social Connections: Unknown (03/13/2023)   Received from [facility]   Social Network    Social Network: Not on file  Intimate Partner Violence: Unknown (03/13/2023)   Received from [facility]   HITS    Physically Hurt: Not on file    Insult or Talk Down To: Not on file    Threaten Physical Harm: Not on file    Scream or Curse: Not on file    Review of Systems  Genitourinary:        Recurrent UTI  Skin:        Skin lesion on scalp  All other systems reviewed and are negative.      Objective    BP 104/69   Pulse 73   Temp 98.2 F (36.8 C) (Oral)   Resp 18   Ht 5\' 4"  (1.626 m)   Wt 181 lb (82.1 kg)   SpO2 98%   BMI 31.07 kg/m   Physical Exam Vitals and nursing note reviewed.  Constitutional:      Appearance: Normal appearance. She is normal weight.  HENT:     Head: Normocephalic and atraumatic.     Right Ear: External ear normal.     Left Ear: External ear normal.     Nose: Nose normal.     Mouth/Throat:     Mouth: Mucous membranes are moist.     Pharynx: Oropharynx is clear.  Eyes:     Conjunctiva/sclera: Conjunctivae normal.     Pupils: Pupils are equal, round, and reactive to light.  Cardiovascular:     Rate and Rhythm: Normal rate and regular rhythm.     Pulses: Normal pulses.     Heart sounds: Normal heart sounds.  Pulmonary:     Effort: Pulmonary effort is normal.     Breath sounds: Normal breath sounds.  Skin:    General: Skin is warm.     Capillary Refill: Capillary refill takes less than 2 seconds.     Comments: Papular lesion to scalp measuring 1x 1 cm   Neurological:     General: No focal deficit present.  Mental Status: She is alert and oriented to person, place, and time. Mental status is at baseline.  Psychiatric:        Mood and Affect: Mood normal.        Behavior: Behavior normal.        Thought Content: Thought content normal.        Judgment: Judgment normal.       Assessment & Plan:   Problem List Items Addressed This Visit   None Encounter to establish care with new doctor  Acquired hypothyroidism  Hormone replacement therapy  Skin lesion of scalp -     Ambulatory referral to Dermatology  Recurrent UTI -     Ambulatory referral to Urology  Screening for cervical cancer -     Ambulatory referral to Obstetrics / Gynecology   To continue seeing [facility] MD for hormone therapy. Referrals placed for Dermatology, Urology and Gyn.  No follow-ups on file.   Suzan Slick, MD  Total time spent with patient today 30 minutes. This includes reviewing records, evaluating the patient and coordinating care. Face-to-face time >50%.

## 2023-09-14 ENCOUNTER — Encounter: Payer: 59 | Admitting: Family Medicine

## 2023-09-20 ENCOUNTER — Other Ambulatory Visit: Payer: Self-pay | Admitting: Family Medicine

## 2023-09-20 DIAGNOSIS — Z1231 Encounter for screening mammogram for malignant neoplasm of breast: Secondary | ICD-10-CM

## 2023-09-21 ENCOUNTER — Telehealth: Payer: Self-pay | Admitting: Urology

## 2023-09-21 NOTE — Telephone Encounter (Signed)
Pt called and lvm about scheduling apt she does have a referral in her chart. I called on 09/21/23 to try and schedule.

## 2023-10-06 ENCOUNTER — Ambulatory Visit: Payer: 59

## 2023-10-10 ENCOUNTER — Ambulatory Visit (AMBULATORY_SURGERY_CENTER): Payer: 59

## 2023-10-10 VITALS — Ht 64.0 in | Wt 175.0 lb

## 2023-10-10 DIAGNOSIS — Z860100 Personal history of colon polyps, unspecified: Secondary | ICD-10-CM

## 2023-10-10 MED ORDER — SUFLAVE 178.7 G PO SOLR: 1.0000 | Freq: Once | ORAL | 0 refills | Status: AC

## 2023-10-10 NOTE — Progress Notes (Signed)

## 2023-10-11 ENCOUNTER — Encounter: Payer: Self-pay | Admitting: Dermatology

## 2023-10-11 ENCOUNTER — Ambulatory Visit: Payer: 59 | Admitting: Dermatology

## 2023-10-11 VITALS — BP 126/72 | HR 68

## 2023-10-11 DIAGNOSIS — Z808 Family history of malignant neoplasm of other organs or systems: Secondary | ICD-10-CM | POA: Diagnosis not present

## 2023-10-11 DIAGNOSIS — L821 Other seborrheic keratosis: Secondary | ICD-10-CM

## 2023-10-11 DIAGNOSIS — L570 Actinic keratosis: Secondary | ICD-10-CM | POA: Diagnosis not present

## 2023-10-11 DIAGNOSIS — L814 Other melanin hyperpigmentation: Secondary | ICD-10-CM

## 2023-10-11 NOTE — Progress Notes (Signed)
   New Patient Visit   Subjective  Valerie Bernard is a 48 y.o. female who presents for the following: spot of concern. Lesion was seen by her PCP but has since fallen off. No hx of skin cancer.  Family hx of skin cancer.  The patient has spots, moles and lesions to be evaluated, some may be new or changing.  The following portions of the chart were reviewed this encounter and updated as appropriate: medications, allergies, medical history  Review of Systems:  No other skin or systemic complaints except as noted in HPI or Assessment and Plan.  Objective  Well appearing patient in no apparent distress; mood and affect are within normal limits.  A focused examination was performed of the following areas:  Scalp  Relevant exam findings are noted in the Assessment and Plan.   Assessment & Plan   AK (ACTINIC KERATOSIS) Mid Frontal Scalp Destruction of lesion - Mid Frontal Scalp Complexity: extensive   Destruction method: cryotherapy   Informed consent: discussed and consent obtained   Timeout:  patient name, date of birth, surgical site, and procedure verified Procedure prep:  Patient was prepped and draped in usual sterile fashion Prep type:  Isopropyl alcohol Anesthesia: the lesion was anesthetized in a standard fashion   Anesthetic:  1% lidocaine w/ epinephrine 1-100,000 buffered w/ 8.4% NaHCO3 Lesion destroyed using liquid nitrogen: Yes   Outcome: patient tolerated procedure well with no complications   Post-procedure details: wound care instructions given   SEBORRHEIC KERATOSES   LENTIGINES    SEBORRHEIC KERATOSIS - Stuck-on, waxy, tan-brown papules and/or plaques  - Benign-appearing - Discussed benign etiology and prognosis. - Observe - Call for any changes  LENTIGINES Exam: scattered tan macules Due to sun exposure Treatment Plan: Benign-appearing, observe. Recommend daily broad spectrum sunscreen SPF 30+ to sun-exposed areas, reapply every 2 hours as needed.   Call for any changes  Return if symptoms worsen or fail to improve.  Dominga Ferry, Surg Tech III, am acting as scribe for Gwenith Daily, MD.   Documentation: I have reviewed the above documentation for accuracy and completeness, and I agree with the above.  Gwenith Daily, MD

## 2023-10-11 NOTE — Patient Instructions (Signed)
 Important Information  Due to recent changes in healthcare laws, you may see results of your pathology and/or laboratory studies on MyChart before the doctors have had a chance to review them. We understand that in some cases there may be results that are confusing or concerning to you. Please understand that not all results are received at the same time and often the doctors may need to interpret multiple results in order to provide you with the best plan of care or course of treatment. Therefore, we ask that you please give [place] 2 business days to thoroughly review all your results before contacting the office for clarification. Should we see a critical lab result, you will be contacted sooner.   If You Need Anything After Your Visit  If you have any questions or concerns for your doctor, please call our main line at [phone] If no one answers, please leave a voicemail as directed and we will return your call as soon as possible. Messages left after 4 pm will be answered the following business day.   You may also send [place] a message via MyChart. We typically respond to MyChart messages within 1-2 business days.  For prescription refills, please ask your pharmacy to contact our office. Our fax number is [phone].  If you have an urgent issue when the clinic is closed that cannot wait until the next business day, you can page your doctor at the number below.    Please note that while we do our best to be available for urgent issues outside of office hours, we are not available 24/7.   If you have an urgent issue and are unable to reach [place], you may choose to seek medical care at your doctor's office, retail clinic, urgent care center, or emergency room.  If you have a medical emergency, please immediately call 911 or go to the emergency department. In the event of inclement weather, please call our main line at [phone] for an update on the status of any delays or  closures.  Dermatology Medication Tips: Please keep the boxes that topical medications come in in order to help keep track of the instructions about where and how to use these. Pharmacies typically print the medication instructions only on the boxes and not directly on the medication tubes.   If your medication is too expensive, please contact our office at [phone] or send [place] a message through MyChart.   We are unable to tell what your co-pay for medications will be in advance as this is different depending on your insurance coverage. However, we may be able to find a substitute medication at lower cost or fill out paperwork to get insurance to cover a needed medication.   If a prior authorization is required to get your medication covered by your insurance company, please allow [place] 1-2 business days to complete this process.  Drug prices often vary depending on where the prescription is filled and some pharmacies may offer cheaper prices.  The website www.goodrx.com contains coupons for medications through different pharmacies. The prices here do not account for what the cost may be with help from insurance (it may be cheaper with your insurance), but the website can give you the price if you did not use any insurance.  - You can print the associated coupon and take it with your prescription to the pharmacy.  - You may also stop by our office during regular business hours and pick up a GoodRx coupon card.  - If  you need your prescription sent electronically to a different pharmacy, notify our office through [facility] or by phone at [phone]    Skin Education :   I counseled the patient regarding the following: Sun screen (SPF 30 or greater) should be applied during peak UV exposure (between 10am and 2pm) and reapplied after exercise or swimming.  The ABCDEs of melanoma were reviewed with the patient, and the importance of monthly self-examination of moles was emphasized.  Should any moles change in shape or color, or itch, bleed or burn, pt will contact our office for evaluation sooner then their interval appointment.  Plan: Sunscreen Recommendations I recommended a broad spectrum sunscreen with a SPF of 30 or higher. I explained that SPF 30 sunscreens block approximately 97 percent of the sun's harmful rays. Sunscreens should be applied at least 15 minutes prior to expected sun exposure and then every 2 hours after that as long as sun exposure continues. If swimming or exercising sunscreen should be reapplied every 45 minutes to an hour after getting wet or sweating. One ounce, or the equivalent of a shot glass full of sunscreen, is adequate to protect the skin not covered by a bathing suit. I also recommended a lip balm with a sunscreen as well. Sun protective clothing can be used in lieu of sunscreen but must be worn the entire time you are exposed to the sun's rays. For areas treated with Liquid Nitrogen:  Keep clean with soap and water.  Apply Vaseline or Aquaphor twice daily.

## 2023-10-18 ENCOUNTER — Ambulatory Visit
Admission: RE | Admit: 2023-10-18 | Discharge: 2023-10-18 | Disposition: A | Discharge status: Home / Self Care | Payer: 59 | Source: Ambulatory Visit | Attending: Family Medicine | Admitting: Family Medicine

## 2023-10-18 DIAGNOSIS — Z1231 Encounter for screening mammogram for malignant neoplasm of breast: Secondary | ICD-10-CM

## 2023-10-21 ENCOUNTER — Encounter: Payer: Self-pay | Admitting: Gastroenterology

## 2023-10-21 ENCOUNTER — Encounter: Payer: Self-pay | Admitting: Family Medicine

## 2023-10-26 ENCOUNTER — Other Ambulatory Visit: Payer: Self-pay

## 2023-10-26 ENCOUNTER — Telehealth: Payer: Self-pay | Admitting: Gastroenterology

## 2023-10-26 DIAGNOSIS — Z860100 Personal history of colon polyps, unspecified: Secondary | ICD-10-CM

## 2023-10-26 MED ORDER — NA SULFATE-K SULFATE-MG SULF 17.5-3.13-1.6 GM/177ML PO SOLN: 1.0000 | Freq: Once | ORAL | 0 refills | Status: AC

## 2023-10-26 NOTE — Telephone Encounter (Signed)
 Inbound call from patient, colonoscopy is on 10/28/23 at 7:30 AM. [place] and [facility] could not reach her so her prep order was canceled. Patient now states her instructions will not arrive on time if they mail it again. Is requesting prep medication be sent to local pharmacy so she can begin prepping tomorrow.

## 2023-10-26 NOTE — Telephone Encounter (Signed)
 New rx for suprep sent to local pharmacy. Nex prep instructions sent via mychart pt is aware.

## 2023-10-28 ENCOUNTER — Encounter: Payer: Self-pay | Admitting: Gastroenterology

## 2023-10-28 ENCOUNTER — Ambulatory Visit: Payer: 59 | Admitting: Gastroenterology

## 2023-10-28 VITALS — BP 106/61 | HR 53 | Temp 97.5°F | Resp 13 | Ht 64.0 in | Wt 175.0 lb

## 2023-10-28 DIAGNOSIS — K644 Residual hemorrhoidal skin tags: Secondary | ICD-10-CM

## 2023-10-28 DIAGNOSIS — K648 Other hemorrhoids: Secondary | ICD-10-CM

## 2023-10-28 DIAGNOSIS — K5730 Diverticulosis of large intestine without perforation or abscess without bleeding: Secondary | ICD-10-CM

## 2023-10-28 DIAGNOSIS — Z860101 Personal history of adenomatous and serrated colon polyps: Secondary | ICD-10-CM

## 2023-10-28 DIAGNOSIS — D120 Benign neoplasm of cecum: Secondary | ICD-10-CM | POA: Diagnosis not present

## 2023-10-28 DIAGNOSIS — D125 Benign neoplasm of sigmoid colon: Secondary | ICD-10-CM

## 2023-10-28 DIAGNOSIS — Z860100 Personal history of colon polyps, unspecified: Secondary | ICD-10-CM

## 2023-10-28 DIAGNOSIS — Z1211 Encounter for screening for malignant neoplasm of colon: Secondary | ICD-10-CM | POA: Diagnosis present

## 2023-10-28 MED ORDER — SODIUM CHLORIDE 0.9 % IV SOLN: 500.0000 mL | INTRAVENOUS | Status: AC

## 2023-10-28 NOTE — Patient Instructions (Addendum)
 YOU HAD AN ENDOSCOPIC PROCEDURE TODAY AT THE [facility]:   Refer to the procedure report that was given to you for any specific questions about what was found during the examination.  If the procedure report does not answer your questions, please call your gastroenterologist to clarify.  If you requested that your care partner not be given the details of your procedure findings, then the procedure report has been included in a sealed envelope for you to review at your convenience later.  YOU SHOULD EXPECT: Some feelings of bloating in the abdomen. Passage of more gas than usual.  Walking can help get rid of the air that was put into your GI tract during the procedure and reduce the bloating. If you had a lower endoscopy (such as a colonoscopy or flexible sigmoidoscopy) you may notice spotting of blood in your stool or on the toilet paper. If you underwent a bowel prep for your procedure, you may not have a normal bowel movement for a few days.  Please Note:  You might notice some irritation and congestion in your nose or some drainage.  This is from the oxygen used during your procedure.  There is no need for concern and it should clear up in a day or so.  SYMPTOMS TO REPORT IMMEDIATELY:  Following lower endoscopy (colonoscopy or flexible sigmoidoscopy):  Excessive amounts of blood in the stool  Significant tenderness or worsening of abdominal pains  Swelling of the abdomen that is new, acute  Fever of 100F or higher   For urgent or emergent issues, a gastroenterologist can be reached at any hour by calling [phone]. Do not use MyChart messaging for urgent concerns.    DIET:  We do recommend a small meal at first, but then you may proceed to your regular diet.  Drink plenty of fluids but you should avoid alcoholic beverages for 24 hours.  MEDICATIONS: Continue present medications.  Please see handouts given to you by your recovery nurse: Polyps, Diverticulosis,  Hemorrhoids.  FOLLOW UP: Await pathology results. Repeat colonoscopy in 5 years for surveillance based on pathology results.  Thank you for allowing [place] to provide for your healthcare needs today.  ACTIVITY:  You should plan to take it easy for the rest of today and you should NOT DRIVE or use heavy machinery until tomorrow (because of the sedation medicines used during the test).    FOLLOW UP: Our staff will call the number listed on your records the next business day following your procedure.  We will call around 7:15- 8:00 am to check on you and address any questions or concerns that you may have regarding the information given to you following your procedure. If we do not reach you, we will leave a message.     If any biopsies were taken you will be contacted by phone or by letter within the next 1-3 weeks.  Please call us at [phone] if you have not heard about the biopsies in 3 weeks.    SIGNATURES/CONFIDENTIALITY: You and/or your care partner have signed paperwork which will be entered into your electronic medical record.  These signatures attest to the fact that that the information above on your After Visit Summary has been reviewed and is understood.  Full responsibility of the confidentiality of this discharge information lies with you and/or your care-partner.

## 2023-10-28 NOTE — Op Note (Signed)
 [facility] Patient Name: Valerie Bernard Procedure Date: 10/28/2023 8:43 AM MRN: [id number] Endoscopist: Napoleon Form , MD, [id number] Age: 48 Referring MD:  Date of Birth: June 15, 1976 Gender: Female Account #: [id number] Procedure:                Colonoscopy Indications:              High risk colon cancer surveillance: Personal                            history of adenoma (10 mm or greater in size), High                            risk colon cancer surveillance: Personal history of                            multiple (3 or more) adenomas Medicines:                Monitored Anesthesia Care Procedure:                Pre-Anesthesia Assessment:                           - Prior to the procedure, a History and Physical                            was performed, and patient medications and                            allergies were reviewed. The patient's tolerance of                            previous anesthesia was also reviewed. The risks                            and benefits of the procedure and the sedation                            options and risks were discussed with the patient.                            All questions were answered, and informed consent                            was obtained. Prior Anticoagulants: The patient has                            taken no anticoagulant or antiplatelet agents. ASA                            Grade Assessment: II - A patient with mild systemic                            disease. After reviewing the risks and benefits,  the patient was deemed in satisfactory condition to                            undergo the procedure.                           After obtaining informed consent, the colonoscope                            was passed under direct vision. Throughout the                            procedure, the patient's blood pressure, pulse, and                            oxygen saturations were  monitored continuously. The                            Olympus Scope [id number] was introduced through the                            anus and advanced to the the cecum, identified by                            appendiceal orifice and ileocecal valve. The                            colonoscopy was performed without difficulty. The                            patient tolerated the procedure well. The quality                            of the bowel preparation was good. The ileocecal                            valve, appendiceal orifice, and rectum were                            photographed. Scope In: 8:57:04 AM Scope Out: 9:12:24 AM Scope Withdrawal Time: 0 hours 11 minutes 28 seconds  Total Procedure Duration: 0 hours 15 minutes 20 seconds  Findings:                 The perianal and digital rectal examinations were                            normal.                           A 1 mm polyp was found in the cecum. The polyp was                            sessile. The polyp was removed with a cold snare.  Resection and retrieval were complete.                           A 5 mm polyp was found in the sigmoid colon. The                            polyp was sessile. The polyp was removed with a                            cold snare. Resection and retrieval were complete.                           A few small-mouthed diverticula were found in the                            sigmoid colon.                           Non-bleeding external and internal hemorrhoids were                            found during retroflexion. The hemorrhoids were                            severe. Complications:            No immediate complications. Estimated Blood Loss:     Estimated blood loss was minimal. Impression:               - One 1 mm polyp in the cecum, removed with a cold                            snare. Resected and retrieved.                           - One 5 mm polyp in the  sigmoid colon, removed with                            a cold snare. Resected and retrieved.                           - Diverticulosis in the sigmoid colon.                           - Non-bleeding external and internal hemorrhoids. Recommendation:           - Patient has a contact number available for                            emergencies. The signs and symptoms of potential                            delayed complications were discussed with the  patient. Return to normal activities tomorrow.                            Written discharge instructions were provided to the                            patient.                           - Resume previous diet.                           - Continue present medications.                           - Await pathology results.                           - Repeat colonoscopy in 5 years for surveillance                            based on pathology results. Napoleon Form, MD 10/28/2023 9:21:54 AM This report has been signed electronically.

## 2023-10-28 NOTE — Progress Notes (Signed)
 Called to room to assist during endoscopic procedure.  Patient ID and intended procedure confirmed with present staff. Received instructions for my participation in the procedure from the performing physician.

## 2023-10-28 NOTE — Progress Notes (Signed)
 A/o x 3, VSS, gd SR's, pleased with anesthesia, report to RN

## 2023-10-28 NOTE — Progress Notes (Signed)
 [facility] History and Physical   Primary Care Physician:  Suzan Slick, MD   Reason for Procedure:  History of adenomatous colon polyps  Plan:    Surveillance colonoscopy with possible interventions as needed     HPI: Valerie Bernard is a very pleasant 48 y.o. female here for surveillance colonoscopy. Denies any nausea, vomiting, abdominal pain, melena or bright red blood per rectum  The risks and benefits as well as alternatives of endoscopic procedure(s) have been discussed and reviewed. All questions answered. The patient agrees to proceed.    Past Medical History:  Diagnosis Date   Allergy    Anxiety    Asthma    seasonal   Family history of adverse reaction to anesthesia    mother-- ponv   GERD (gastroesophageal reflux disease)    no meds   History of gestational diabetes    Hypothyroidism    followed by pcp   Mood disorder [facility])    Sleep apnea     Past Surgical History:  Procedure Laterality Date   BREAST BIOPSY Right 07/09/2020   DILATATION & CURETTAGE/HYSTEROSCOPY WITH MYOSURE N/A 02/20/2019   Procedure: DILATATION & CURETTAGE/HYSTEROSCOPY WITH MYOSURE;  Surgeon: Genia Del, MD;  Location: [facility];  Service: Gynecology;  Laterality: N/A;    Prior to Admission medications   Medication Sig Start Date End Date Taking? Authorizing Provider  ibuprofen (ADVIL,MOTRIN) 200 MG tablet Take 400 mg by mouth every 6 (six) hours as needed for headache or mild pain.   Yes [provider]  levothyroxine (SYNTHROID) 175 MCG tablet Take 175 mcg by mouth every morning.   Yes [provider]  liothyronine (CYTOMEL) 5 MCG tablet Take 5 mcg by mouth 2 (two) times daily.   Yes [provider]  ALBUTEROL IN Inhale into the lungs as needed.    [provider]  Cyanocobalamin (VITAMIN B-12 PO) Take by mouth. Patient not taking: Reported on 10/28/2023    [provider]  levonorgestrel (MIRENA)  20 MCG/DAY IUD 1 each by Intrauterine route once.    [provider]  Testosterone 25 MG PLLT by Implant route. Every 3 months    [provider]    Current Outpatient Medications  Medication Sig Dispense Refill   ibuprofen (ADVIL,MOTRIN) 200 MG tablet Take 400 mg by mouth every 6 (six) hours as needed for headache or mild pain.     levothyroxine (SYNTHROID) 175 MCG tablet Take 175 mcg by mouth every morning.     liothyronine (CYTOMEL) 5 MCG tablet Take 5 mcg by mouth 2 (two) times daily.     ALBUTEROL IN Inhale into the lungs as needed.     Cyanocobalamin (VITAMIN B-12 PO) Take by mouth. (Patient not taking: Reported on 10/28/2023)     levonorgestrel (MIRENA) 20 MCG/DAY IUD 1 each by Intrauterine route once.     Testosterone 25 MG PLLT by Implant route. Every 3 months     Current Facility-Administered Medications  Medication Dose Route Frequency Provider Last Rate Last Admin   0.9 %  sodium chloride infusion  500 mL Intravenous Continuous Philbert Ocallaghan, Eleonore Chiquito, MD        Allergies as of 10/28/2023   (No Known Allergies)    Family History  Problem Relation Age of Onset   Asthma Mother    Melanoma Mother    Colon polyps Father    Diabetes Brother    Arthritis Maternal Grandmother    Arthritis Maternal Grandfather    Cancer  Paternal Grandfather        lung   Colon cancer Paternal Grandfather    Asthma Daughter    Allergies Daughter    Asthma Son    Esophageal cancer Neg Hx    Rectal cancer Neg Hx    Stomach cancer Neg Hx    Breast cancer Neg Hx     Social History   Socioeconomic History   Marital status: Married    Spouse name: Not on file   Number of children: Not on file   Years of education: Not on file   Highest education level: Not on file  Occupational History   Not on file  Tobacco Use   Smoking status: Every Day    Current packs/day: 0.25    Average packs/day: 0.3 packs/day for 20.0 years (5.0 ttl pk-yrs)    Types: Cigarettes    Passive  exposure: Current   Smokeless tobacco: Never  Vaping Use   Vaping status: Never Used  Substance and Sexual Activity   Alcohol use: Yes    Comment: once a week   Drug use: Not Currently   Sexual activity: Yes    Partners: Male    Birth control/protection: Surgical    Comment: 1st intercourse- 62, partners- 109, married- 20 yrs, husband vasectomy  Other Topics Concern   Not on file  Social History Narrative   Not on file   Social Drivers of Health   Financial Resource Strain: Not on file  Food Insecurity: Not on file  Transportation Needs: Not on file  Physical Activity: Not on file  Stress: Not on file  Social Connections: Unknown (03/13/2023)   Received from [facility]   Social Network    Social Network: Not on file  Intimate Partner Violence: Unknown (03/13/2023)   Received from [facility]   HITS    Physically Hurt: Not on file    Insult or Talk Down To: Not on file    Threaten Physical Harm: Not on file    Scream or Curse: Not on file    Review of Systems:  All other review of systems negative except as mentioned in the HPI.  Physical Exam: Vital signs in last 24 hours: BP 103/67   Pulse 62   Temp (!) 97.5 F (36.4 C)   Ht 5\' 4"  (1.626 m)   Wt 175 lb (79.4 kg)   SpO2 98%   BMI 30.04 kg/m  General:   Alert, NAD Lungs:  Clear .   Heart:  Regular rate and rhythm Abdomen:  Soft, nontender and nondistended. Neuro/Psych:  Alert and cooperative. Normal mood and affect. A and O x 3  Reviewed labs, radiology imaging, old records and pertinent past GI work up  Patient is appropriate for planned procedure(s) and anesthesia in an ambulatory setting   K. Scherry Ran , MD [phone]

## 2023-10-28 NOTE — Progress Notes (Signed)
 Pt's states no medical or surgical changes since previsit or office visit.

## 2023-10-31 ENCOUNTER — Telehealth: Payer: Self-pay

## 2023-10-31 NOTE — Telephone Encounter (Signed)
  Follow up Call-     10/28/2023    8:13 AM  Call back number  Post procedure Call Back phone  # [phone]  Permission to leave phone message Yes     Left message

## 2023-11-01 LAB — SURGICAL PATHOLOGY

## 2023-12-22 ENCOUNTER — Encounter: Payer: Self-pay | Admitting: Gastroenterology

## 2024-02-24 ENCOUNTER — Ambulatory Visit
Admission: EM | Admit: 2024-02-24 | Discharge: 2024-02-24 | Disposition: A | Discharge status: Home / Self Care | Attending: Family Medicine | Admitting: Family Medicine

## 2024-02-24 DIAGNOSIS — S39012A Strain of muscle, fascia and tendon of lower back, initial encounter: Secondary | ICD-10-CM

## 2024-02-24 DIAGNOSIS — M62830 Muscle spasm of back: Secondary | ICD-10-CM | POA: Diagnosis not present

## 2024-02-24 DIAGNOSIS — M5441 Lumbago with sciatica, right side: Secondary | ICD-10-CM | POA: Diagnosis not present

## 2024-02-24 MED ORDER — METHYLPREDNISOLONE ACETATE 80 MG/ML IJ SUSP
80.0000 mg | Freq: Once | INTRAMUSCULAR | Status: AC
  Administered 2024-02-24: 80 mg via INTRAMUSCULAR

## 2024-02-24 MED ORDER — METHOCARBAMOL 500 MG PO TABS: 500.0000 mg | ORAL_TABLET | Freq: Three times a day (TID) | ORAL | 0 refills | Status: AC

## 2024-02-24 MED ORDER — OXYCODONE-ACETAMINOPHEN 5-325 MG PO TABS: 1.0000 | ORAL_TABLET | Freq: Three times a day (TID) | ORAL | 0 refills | Status: DC | PRN

## 2024-02-24 MED ORDER — PREDNISONE 10 MG (21) PO TBPK: ORAL_TABLET | Freq: Every day | ORAL | 0 refills | Status: DC

## 2024-02-24 NOTE — Discharge Instructions (Addendum)
 Advised patient to take medications as directed with food to completion.  Advised may take Robaxin  daily or as needed for accompanying muscle spasms of lower back.  Advised may take Percocet for acute/severe breakthrough pain.  Patient advised of sedative effect of this medication.  Encouraged increase daily water intake to 64 ounces per day while taking these medications.  Advised if symptoms worsen and/or unresolved please follow-up with your PCP or here for further evaluation.

## 2024-02-24 NOTE — ED Triage Notes (Signed)
 Pt presents to [facility] with co back pain since yesterday morning. Pt reports she was in a hotel Wednesday night that the elevators were not working so she hauled her luggage up the stairs and thinks she may have aggravated her back doing that.  Pt reports pmh of bulging disc in that area. Pt has taken tylenol  with minimal improvement.

## 2024-02-24 NOTE — ED Provider Notes (Signed)
 [name]    CSN: [id number] Arrival date & time: 02/24/24  9062      History   Chief Complaint Chief Complaint  Patient presents with   Back Pain    HPI Valerie  Bernard is a 48 y.o. female.   HPI Pleasant 48 year old female presents with back pain since yesterday morning.  Patient reports was staying in a hotel on Wednesday night with elevator is not working so she carried heavy luggage upstairs and believes she may have aggravated her lower back.  PMH significant for lumbar bulging disc, obesity, and tobacco abuse.  Past Medical History:  Diagnosis Date   Allergy    Anxiety    Asthma    seasonal   Family history of adverse reaction to anesthesia    mother-- ponv   GERD (gastroesophageal reflux disease)    no meds   History of gestational diabetes    Hypothyroidism    followed by pcp   Mood disorder (HCC)    Sleep apnea     Patient Active Problem List   Diagnosis Date Noted   Obesity (BMI 35.0-39.9 without comorbidity) 02/19/2021   Hypothyroidism 06/29/2007   Anxiety state 06/29/2007   TOBACCO ABUSE 06/29/2007   NORMAL DELIVERY 06/29/2007    Past Surgical History:  Procedure Laterality Date   BREAST BIOPSY Right 07/09/2020   DILATATION & CURETTAGE/HYSTEROSCOPY WITH MYOSURE N/A 02/20/2019   Procedure: DILATATION & CURETTAGE/HYSTEROSCOPY WITH MYOSURE;  Surgeon: Lavoie, Marie-Lyne, MD;  Location: [facility];  Service: Gynecology;  Laterality: N/A;    OB History     Gravida  3   Para  3   Term      Preterm      AB      Living  3      SAB      IAB      Ectopic      Multiple      Live Births               Home Medications    Prior to Admission medications   Medication Sig Start Date End Date Taking? Authorizing Provider  methocarbamol  (ROBAXIN ) 500 MG tablet Take 1 tablet (500 mg total) by mouth 3 (three) times daily for 7 days. 02/24/24 03/02/24 Yes Teddy Sharper, FNP  oxyCODONE -acetaminophen   (PERCOCET/ROXICET) 5-325 MG tablet Take 1 tablet by mouth every 8 (eight) hours as needed for severe pain (pain score 7-10). 02/24/24  Yes Teddy Sharper, FNP  predniSONE  (STERAPRED UNI-PAK 21 TAB) 10 MG (21) TBPK tablet Take by mouth daily. Take 6 tabs by mouth daily  for 2 days, then 5 tabs for 2 days, then 4 tabs for 2 days, then 3 tabs for 2 days, 2 tabs for 2 days, then 1 tab by mouth daily for 2 days 02/24/24  Yes Teddy Sharper, FNP  ALBUTEROL IN Inhale into the lungs as needed.    [provider]  Cyanocobalamin (VITAMIN B-12 PO) Take by mouth. Patient not taking: Reported on 10/28/2023    [provider]  ibuprofen (ADVIL,MOTRIN) 200 MG tablet Take 400 mg by mouth every 6 (six) hours as needed for headache or mild pain.    [provider]  levonorgestrel  (MIRENA ) 20 MCG/DAY IUD 1 each by Intrauterine route once.    [provider]  levothyroxine  (SYNTHROID ) 175 MCG tablet Take 175 mcg by mouth every morning.    [provider]  liothyronine (CYTOMEL) 5 MCG tablet Take 5 mcg by mouth  2 (two) times daily.    [provider]  Testosterone 25 MG PLLT by Implant route. Every 3 months    [provider]    Family History Family History  Problem Relation Age of Onset   Asthma Mother    Melanoma Mother    Colon polyps Father    Diabetes Brother    Arthritis Maternal Grandmother    Arthritis Maternal Grandfather    Cancer Paternal Grandfather        lung   Colon cancer Paternal Grandfather    Asthma Daughter    Allergies Daughter    Asthma Son    Esophageal cancer Neg Hx    Rectal cancer Neg Hx    Stomach cancer Neg Hx    Breast cancer Neg Hx     Social History Social History   Tobacco Use   Smoking status: Every Day    Current packs/day: 0.25    Average packs/day: 0.3 packs/day for 20.0 years (5.0 ttl pk-yrs)    Types: Cigarettes    Passive exposure: Current   Smokeless tobacco: Never  Vaping Use   Vaping status:  Never Used  Substance Use Topics   Alcohol use: Yes    Alcohol/week: 7.0 standard drinks of alcohol    Types: 7 Glasses of wine per week   Drug use: Not Currently     Allergies   Patient has no known allergies.   Review of Systems Review of Systems   Physical Exam Triage Vital Signs ED Triage Vitals  Encounter Vitals Group     BP 02/24/24 1006 122/81     Girls Systolic BP Percentile --      Girls Diastolic BP Percentile --      Boys Systolic BP Percentile --      Boys Diastolic BP Percentile --      Pulse Rate 02/24/24 1006 (!) 57     Resp 02/24/24 1006 19     Temp 02/24/24 1006 98.1 F (36.7 C)     Temp src --      SpO2 02/24/24 1006 98 %     Weight --      Height --      Head Circumference --      Peak Flow --      Pain Score 02/24/24 1005 7     Pain Loc --      Pain Education --      Exclude from Growth Chart --    No data found.  Updated Vital Signs BP 122/81   Pulse (!) 57   Temp 98.1 F (36.7 C)   Resp 19   SpO2 98%   Visual Acuity Right Eye Distance:   Left Eye Distance:   Bilateral Distance:    Right Eye Near:   Left Eye Near:    Bilateral Near:     Physical Exam Vitals and nursing note reviewed.  Constitutional:      Appearance: Normal appearance. She is normal weight.  HENT:     Head: Normocephalic and atraumatic.     Mouth/Throat:     Mouth: Mucous membranes are moist.     Pharynx: Oropharynx is clear.  Eyes:     Extraocular Movements: Extraocular movements intact.     Conjunctiva/sclera: Conjunctivae normal.     Pupils: Pupils are equal, round, and reactive to light.  Cardiovascular:     Rate and Rhythm: Normal rate and regular rhythm.     Pulses: Normal pulses.  Heart sounds: Normal heart sounds.  Pulmonary:     Effort: Pulmonary effort is normal.     Breath sounds: Normal breath sounds. No wheezing, rhonchi or rales.  Musculoskeletal:        General: Normal range of motion.  Skin:    General: Skin is warm and dry.   Neurological:     General: No focal deficit present.     Mental Status: She is alert and oriented to person, place, and time. Mental status is at baseline.      UC Treatments / Results  Labs (all labs ordered are listed, but only abnormal results are displayed) Labs Reviewed - No data to display  EKG   Radiology No results found.  Procedures Procedures (including critical care time)  Medications Ordered in UC Medications  methylPREDNISolone  acetate (DEPO-MEDROL ) injection 80 mg (80 mg Intramuscular Given 02/24/24 1041)    Initial Impression / Assessment and Plan / UC Course  I have reviewed the triage vital signs and the nursing notes.  Pertinent labs & imaging results that were available during my care of the patient were reviewed by me and considered in my medical decision making (see chart for details).     MDM: 1.  Acute midline low back pain with right-sided sciatica-epic review MRI of lumbar spine without contrast 09/06/2010 revealed broad-based disc herniation at L5/S1, disc degeneration with annular tearing annular bulging.  IM Depo-Medrol  80 mg given once in clinic, Rx'd Percocet 5/325 mg tablet, take 1 tablet every 8 hours, as needed for severe/acute lower back pain; 2.  Strain of lumbar region, initial encounter-Rx'd Sterapred Unipak (42 tab 10 mg taper). 3.  Muscle spasm of back-Rx'd Robaxin  500 mg tablet: Take 1 tablet 3 times daily, as needed for muscle spasms of back. Final Clinical Impressions(s) / UC Diagnoses   Final diagnoses:  Strain of lumbar region, initial encounter  Muscle spasm of back  Acute midline low back pain with right-sided sciatica     Discharge Instructions      Advised patient to take medications as directed with food to completion.  Advised may take Robaxin  daily or as needed for accompanying muscle spasms of lower back.  Advised may take Percocet for acute/severe breakthrough pain.  Patient advised of sedative effect of this  medication.  Encouraged increase daily water intake to 64 ounces per day while taking these medications.  Advised if symptoms worsen and/or unresolved please follow-up with your PCP or here for further evaluation.     ED Prescriptions     Medication Sig Dispense Auth. Provider   oxyCODONE -acetaminophen  (PERCOCET/ROXICET) 5-325 MG tablet Take 1 tablet by mouth every 8 (eight) hours as needed for severe pain (pain score 7-10). 15 tablet Chico Cawood, FNP   methocarbamol  (ROBAXIN ) 500 MG tablet Take 1 tablet (500 mg total) by mouth 3 (three) times daily for 7 days. 21 tablet Aries Kasa, FNP   predniSONE  (STERAPRED UNI-PAK 21 TAB) 10 MG (21) TBPK tablet Take by mouth daily. Take 6 tabs by mouth daily  for 2 days, then 5 tabs for 2 days, then 4 tabs for 2 days, then 3 tabs for 2 days, 2 tabs for 2 days, then 1 tab by mouth daily for 2 days 42 tablet Teddy Sharper, FNP      I have reviewed the PDMP during this encounter.   Teddy Sharper, FNP 02/24/24 1115

## 2024-03-01 ENCOUNTER — Encounter: Payer: Self-pay | Admitting: Family Medicine

## 2024-03-01 DIAGNOSIS — M545 Low back pain, unspecified: Secondary | ICD-10-CM

## 2024-04-18 ENCOUNTER — Ambulatory Visit

## 2024-04-18 ENCOUNTER — Ambulatory Visit (INDEPENDENT_AMBULATORY_CARE_PROVIDER_SITE_OTHER): Admitting: Family Medicine

## 2024-04-18 ENCOUNTER — Encounter: Payer: Self-pay | Admitting: Family Medicine

## 2024-04-18 VITALS — BP 128/79 | HR 64 | Temp 98.0°F | Resp 18 | Ht 64.0 in | Wt 197.4 lb

## 2024-04-18 DIAGNOSIS — G8929 Other chronic pain: Secondary | ICD-10-CM | POA: Diagnosis not present

## 2024-04-18 DIAGNOSIS — Z23 Encounter for immunization: Secondary | ICD-10-CM

## 2024-04-18 DIAGNOSIS — M545 Low back pain, unspecified: Secondary | ICD-10-CM | POA: Diagnosis not present

## 2024-04-18 DIAGNOSIS — R635 Abnormal weight gain: Secondary | ICD-10-CM

## 2024-04-18 NOTE — Progress Notes (Signed)
 Established Patient Office Visit  Subjective   Patient ID: Valerie  Bernard, female    DOB: May 19, 1976  Age: 48 y.o. MRN: [id number]  Chief Complaint  Patient presents with   Weight Loss    Patient is here to discuss weight loss options   Back Pain    Patient states that her prior back injury 6 weeks ago is still bothering her.     Back Pain   Chronic back pain Pt reports she's dealt with back pain for the last 10 years. She injured her back in July while carrying things up 3 flight of stairs. She woke up the next day and couldn't walk. She went to urgent care and got steroid shot and medicine to take. This improved but last weekend, she started having pain again. She says normally she has back pain only. She's had MRI of lumbar spine last one 2011 and showed Broad-based disc herniation at L5-S1 slightly more prominent towards the left.  This could effect either S1 nerve root, more likely the left. Disc degeneration at L4-5 with annular tearing annular bulging.  No apparent neural compression.  Mild facet degeneration. She says now the pain 4/10.  Weight management Pt was on [employer] program and lost 70 lbs in 6 months. She is interested in GLP-1 injections.  Pt would like flu vaccine.  Review of Systems  Musculoskeletal:  Positive for back pain.  All other systems reviewed and are negative.     Objective:     BP 128/79   Pulse 64   Temp 98 F (36.7 C) (Oral)   Resp 18   Ht 5' 4 (1.626 m)   Wt 197 lb 6.4 oz (89.5 kg)   SpO2 99%   BMI 33.88 kg/m  BP Readings from Last 3 Encounters:  04/18/24 128/79  02/24/24 122/81  10/28/23 106/61      Physical Exam Vitals and nursing note reviewed.  Constitutional:      Appearance: Normal appearance. She is normal weight.  HENT:     Head: Normocephalic and atraumatic.     Right Ear: External ear normal.     Left Ear: External ear normal.     Nose: Nose normal.     Mouth/Throat:     Mouth: Mucous membranes are moist.      Pharynx: Oropharynx is clear.  Eyes:     Conjunctiva/sclera: Conjunctivae normal.     Pupils: Pupils are equal, round, and reactive to light.  Cardiovascular:     Rate and Rhythm: Normal rate.  Pulmonary:     Effort: Pulmonary effort is normal.  Skin:    General: Skin is warm.     Capillary Refill: Capillary refill takes less than 2 seconds.  Neurological:     General: No focal deficit present.     Mental Status: She is alert and oriented to person, place, and time. Mental status is at baseline.  Psychiatric:        Mood and Affect: Mood normal.        Behavior: Behavior normal.        Thought Content: Thought content normal.        Judgment: Judgment normal.     No results found for any visits on 04/18/24.     The ASCVD Risk score (Arnett DK, et al., 2019) failed to calculate for the following reasons:   Cannot find a previous HDL lab   Cannot find a previous total cholesterol lab    Assessment & Plan:  Problem List Items Addressed This Visit   None Chronic midline low back pain without sciatica -     DG Lumbar Spine Complete; Future  Weight gain  Need for influenza vaccination -     Flu vaccine trivalent PF, 6mos and older(Flulaval,Afluria,Fluarix,Fluzone)   Pt with chronic back pain. Send for xrays. Refer to PMR pending results She is interested in GLP-1 injections. To return for labs and A1c during CPE. Flu vaccine today   No follow-ups on file.    Torrence CINDERELLA Barrier, MD

## 2024-04-25 ENCOUNTER — Encounter: Admitting: Family Medicine

## 2024-04-27 ENCOUNTER — Ambulatory Visit: Payer: Self-pay | Admitting: Family Medicine

## 2024-05-02 ENCOUNTER — Other Ambulatory Visit: Payer: Self-pay | Admitting: Family Medicine

## 2024-05-02 ENCOUNTER — Encounter: Payer: Self-pay | Admitting: Family Medicine

## 2024-05-02 ENCOUNTER — Ambulatory Visit (INDEPENDENT_AMBULATORY_CARE_PROVIDER_SITE_OTHER): Admitting: Family Medicine

## 2024-05-02 VITALS — BP 117/74 | HR 63 | Temp 98.2°F | Resp 18 | Ht 64.0 in | Wt 197.7 lb

## 2024-05-02 DIAGNOSIS — M545 Low back pain, unspecified: Secondary | ICD-10-CM

## 2024-05-02 DIAGNOSIS — Z0000 Encounter for general adult medical examination without abnormal findings: Secondary | ICD-10-CM

## 2024-05-02 DIAGNOSIS — Z13220 Encounter for screening for lipoid disorders: Secondary | ICD-10-CM

## 2024-05-02 DIAGNOSIS — R7302 Impaired glucose tolerance (oral): Secondary | ICD-10-CM | POA: Diagnosis not present

## 2024-05-02 DIAGNOSIS — Z136 Encounter for screening for cardiovascular disorders: Secondary | ICD-10-CM

## 2024-05-02 NOTE — Progress Notes (Signed)
 Complete physical exam  Patient: Valerie  Bernard   DOB: [birth year]   47 y.o. Female  MRN: [id number]  Subjective:    Chief Complaint  Patient presents with   Annual Exam    Valerie  Bernard is a 48 y.o. female who presents today for a complete physical exam. She reports consuming a general diet. The patient does not participate in regular exercise at present. She generally feels well. She reports sleeping well. She does not have additional problems to discuss today.    Most recent fall risk assessment:    05/02/2024    2:07 PM  Fall Risk   Falls in the past year? 0  Number falls in past yr: 0  Injury with Fall? 0  Risk for fall due to : No Fall Risks  Follow up Falls evaluation completed     Most recent depression screenings:    05/02/2024    2:07 PM 04/18/2024    3:54 PM  PHQ 2/9 Scores  PHQ - 2 Score 2 0  PHQ- 9 Score 5 4    Vision:Within last year  Patient Active Problem List   Diagnosis Date Noted   Obesity (BMI 35.0-39.9 without comorbidity) 02/19/2021   Hypothyroidism 06/29/2007   Anxiety state 06/29/2007   TOBACCO ABUSE 06/29/2007   NORMAL DELIVERY 06/29/2007   Past Medical History:  Diagnosis Date   Allergy    Anxiety    Asthma    seasonal   Family history of adverse reaction to anesthesia    mother-- ponv   GERD (gastroesophageal reflux disease)    no meds   History of gestational diabetes    Hypothyroidism    followed by pcp   Mood disorder (HCC)    Sleep apnea    Past Surgical History:  Procedure Laterality Date   BREAST BIOPSY Right 07/09/2020   DILATATION & CURETTAGE/HYSTEROSCOPY WITH MYOSURE N/A 02/20/2019   Procedure: DILATATION & CURETTAGE/HYSTEROSCOPY WITH MYOSURE;  Surgeon: Lavoie, Marie-Lyne, MD;  Location: [facility];  Service: Gynecology;  Laterality: N/A;   Social History   Tobacco Use   Smoking status: Every Day    Current packs/day: 0.25    Average packs/day: 0.3 packs/day for 20.0 years (5.0 ttl pk-yrs)     Types: Cigarettes    Passive exposure: Current   Smokeless tobacco: Never  Vaping Use   Vaping status: Never Used  Substance Use Topics   Alcohol use: Yes    Alcohol/week: 7.0 standard drinks of alcohol    Types: 7 Glasses of wine per week   Drug use: Not Currently   Family Status  Relation Name Status   Mother  Alive   Father  Alive   Brother  Alive   MGM  Deceased   MGF  Deceased   PGM  Alive   PGF  Deceased   Daughter  Alive   Daughter  Alive   Son  Alive   Neg Hx  (Not Specified)  No partnership data on file   No Known Allergies    Patient Care Team: Colette Torrence GRADE, MD as PCP - General (Family Medicine)   Outpatient Medications Prior to Visit  Medication Sig   ALBUTEROL IN Inhale into the lungs as needed.   Cyanocobalamin (VITAMIN B-12 PO) Take by mouth. (Patient not taking: Reported on 04/18/2024)   ibuprofen (ADVIL,MOTRIN) 200 MG tablet Take 400 mg by mouth every 6 (six) hours as needed for headache or mild pain.   levonorgestrel  (MIRENA ) 20 MCG/DAY IUD 1  each by Intrauterine route once.   levothyroxine  (SYNTHROID ) 175 MCG tablet Take 175 mcg by mouth every morning.   liothyronine (CYTOMEL) 5 MCG tablet Take 5 mcg by mouth 2 (two) times daily.   Testosterone 25 MG PLLT by Implant route. Every 3 months   No facility-administered medications prior to visit.    Review of Systems  All other systems reviewed and are negative.         Objective:     BP 117/74   Pulse 63   Temp 98.2 F (36.8 C) (Oral)   Resp 18   Ht 5' 4 (1.626 m)   Wt 197 lb 11.2 oz (89.7 kg)   SpO2 97%   BMI 33.94 kg/m  BP Readings from Last 3 Encounters:  05/02/24 117/74  04/18/24 128/79  02/24/24 122/81      Physical Exam Vitals and nursing note reviewed.  Constitutional:      Appearance: Normal appearance. She is normal weight.  HENT:     Head: Normocephalic and atraumatic.     Right Ear: Tympanic membrane, ear canal and external ear normal.     Left Ear: Tympanic  membrane, ear canal and external ear normal.     Nose: Nose normal.     Mouth/Throat:     Mouth: Mucous membranes are moist.     Pharynx: Oropharynx is clear.  Eyes:     Conjunctiva/sclera: Conjunctivae normal.     Pupils: Pupils are equal, round, and reactive to light.  Cardiovascular:     Rate and Rhythm: Normal rate and regular rhythm.     Pulses: Normal pulses.     Heart sounds: Normal heart sounds.  Pulmonary:     Effort: Pulmonary effort is normal.     Breath sounds: Normal breath sounds.  Abdominal:     General: Abdomen is flat. Bowel sounds are normal.  Skin:    General: Skin is warm.     Capillary Refill: Capillary refill takes less than 2 seconds.  Neurological:     General: No focal deficit present.     Mental Status: She is alert and oriented to person, place, and time. Mental status is at baseline.  Psychiatric:        Mood and Affect: Mood normal.        Behavior: Behavior normal.        Thought Content: Thought content normal.        Judgment: Judgment normal.     No results found for any visits on 05/02/24. Last CBC Lab Results  Component Value Date   WBC 10.0 02/20/2019   HGB 15.4 (H) 02/20/2019   HCT 45.8 02/20/2019   MCV 92.9 02/20/2019   MCH 31.2 02/20/2019   RDW 13.3 02/20/2019   PLT 265 02/20/2019   Last metabolic panel Lab Results  Component Value Date   GLUCOSE 90 03/25/2018   NA 140 03/25/2018   K 3.5 03/25/2018   CL 107 03/25/2018   CO2 25 03/25/2018   BUN 7 03/25/2018   CREATININE 0.97 03/25/2018   GFRNONAA >60 03/25/2018   CALCIUM 8.5 (L) 03/25/2018   PROT 6.2 (L) 03/25/2018   ALBUMIN 3.4 (L) 03/25/2018   BILITOT 0.4 03/25/2018   ALKPHOS 53 03/25/2018   AST 18 03/25/2018   ALT 17 03/25/2018   ANIONGAP 8 03/25/2018   Last lipids Lab Results  Component Value Date   CHOL 182 10/06/2012   HDL 52 10/06/2012   LDLCALC 113 (H) 10/06/2012   TRIG  84 10/06/2012   CHOLHDL 3.5 10/06/2012   Last hemoglobin A1c No results found  for: HGBA1C      Assessment & Plan:    Routine Health Maintenance and Physical Exam  Immunization History  Administered Date(s) Administered   Influenza, Seasonal, Injecte, Preservative Fre 08/25/2012, 04/18/2024   PFIZER(Purple Top)SARS-COV-2 Vaccination 10/13/2019, 11/03/2019   PPD Test 11/27/2014    Health Maintenance  Topic Date Due   Hepatitis C Screening  Never done   DTaP/Tdap/Td (1 - Tdap) Never done   Pneumococcal Vaccine (1 of 2 - PCV) Never done   Hepatitis B Vaccines 19-59 Average Risk (1 of 3 - 19+ 3-dose series) Never done   COVID-19 Vaccine (3 - Pfizer risk series) 12/01/2019   Cervical Cancer Screening (HPV/Pap Cotest)  04/03/2023   Mammogram  10/17/2025   Colonoscopy  10/27/2028   Influenza Vaccine  Completed   HIV Screening  Completed   HPV VACCINES  Aged Out   Meningococcal B Vaccine  Aged Out    Discussed health benefits of physical activity, and encouraged her to engage in regular exercise appropriate for her age and condition.  Problem List Items Addressed This Visit   None Visit Diagnoses       Annual physical exam    -  Primary     Encounter for lipid screening for cardiovascular disease         Impaired glucose tolerance          No follow-ups on file. Annual physical exam  Encounter for lipid screening for cardiovascular disease -     Lipid panel  Impaired glucose tolerance -     CBC with Differential/Platelet -     Comprehensive metabolic panel with GFR -     Hemoglobin A1c   Screening CPE labs See in 1 year sooner prn.     Torrence CINDERELLA Barrier, MD

## 2024-05-03 ENCOUNTER — Ambulatory Visit: Payer: Self-pay | Admitting: Family Medicine

## 2024-05-03 LAB — CBC WITH DIFFERENTIAL/PLATELET
Basophils Absolute: 0.1 x10E3/uL (ref 0.0–0.2)
Basos: 1 %
EOS (ABSOLUTE): 0.4 x10E3/uL (ref 0.0–0.4)
Eos: 4 %
Hematocrit: 42.3 % (ref 34.0–46.6)
Hemoglobin: 14 g/dL (ref 11.1–15.9)
Immature Grans (Abs): 0 x10E3/uL (ref 0.0–0.1)
Immature Granulocytes: 0 %
Lymphocytes Absolute: 3.6 x10E3/uL — ABNORMAL HIGH (ref 0.7–3.1)
Lymphs: 42 %
MCH: 31.8 pg (ref 26.6–33.0)
MCHC: 33.1 g/dL (ref 31.5–35.7)
MCV: 96 fL (ref 79–97)
Monocytes Absolute: 0.7 x10E3/uL (ref 0.1–0.9)
Monocytes: 8 %
Neutrophils Absolute: 4 x10E3/uL (ref 1.4–7.0)
Neutrophils: 45 %
Platelets: 236 x10E3/uL (ref 150–450)
RBC: 4.4 x10E6/uL (ref 3.77–5.28)
RDW: 12.3 % (ref 11.7–15.4)
WBC: 8.8 x10E3/uL (ref 3.4–10.8)

## 2024-05-03 LAB — COMPREHENSIVE METABOLIC PANEL WITH GFR
ALT: 15 IU/L (ref 0–32)
AST: 18 IU/L (ref 0–40)
Albumin: 4.6 g/dL (ref 3.9–4.9)
Alkaline Phosphatase: 46 IU/L (ref 41–116)
BUN/Creatinine Ratio: 15 (ref 9–23)
BUN: 12 mg/dL (ref 6–24)
Bilirubin Total: 0.4 mg/dL (ref 0.0–1.2)
CO2: 22 mmol/L (ref 20–29)
Calcium: 9.4 mg/dL (ref 8.7–10.2)
Chloride: 101 mmol/L (ref 96–106)
Creatinine, Ser: 0.82 mg/dL (ref 0.57–1.00)
Globulin, Total: 2.6 g/dL (ref 1.5–4.5)
Glucose: 84 mg/dL (ref 70–99)
Potassium: 4.5 mmol/L (ref 3.5–5.2)
Sodium: 139 mmol/L (ref 134–144)
Total Protein: 7.2 g/dL (ref 6.0–8.5)
eGFR: 88 mL/min/1.73 (ref >59)

## 2024-05-03 LAB — HEMOGLOBIN A1C
Est. average glucose Bld gHb Est-mCnc: 114 mg/dL
Hgb A1c MFr Bld: 5.6 % (ref 4.8–5.6)

## 2024-05-03 LAB — LIPID PANEL
Chol/HDL Ratio: 3.4 ratio (ref 0.0–4.4)
Cholesterol, Total: 245 mg/dL — ABNORMAL HIGH (ref 100–199)
HDL: 72 mg/dL (ref >39)
LDL Chol Calc (NIH): 161 mg/dL — ABNORMAL HIGH (ref 0–99)
Triglycerides: 72 mg/dL (ref 0–149)
VLDL Cholesterol Cal: 12 mg/dL (ref 5–40)

## 2024-05-09 ENCOUNTER — Other Ambulatory Visit: Payer: Self-pay | Admitting: Family Medicine

## 2024-05-09 DIAGNOSIS — D72820 Lymphocytosis (symptomatic): Secondary | ICD-10-CM | POA: Insufficient documentation

## 2024-05-17 ENCOUNTER — Ambulatory Visit: Admitting: Physical Therapy
